# Patient Record
Sex: Male | Born: 1960 | Race: White | Hispanic: No | Marital: Married | Smoking: Former smoker
Health system: Southern US, Community
[De-identification: ages and names within clinical notes are randomized; demographics above are authoritative.]

## PROBLEM LIST (undated history)

## (undated) DIAGNOSIS — K635 Polyp of colon: Secondary | ICD-10-CM

## (undated) DIAGNOSIS — C3490 Malignant neoplasm of unspecified part of unspecified bronchus or lung: Secondary | ICD-10-CM

## (undated) DIAGNOSIS — E800 Hereditary erythropoietic porphyria: Secondary | ICD-10-CM

## (undated) HISTORY — DX: Polyp of colon: K63.5

## (undated) HISTORY — PX: TESTICLE SURGERY: SHX794

## (undated) HISTORY — DX: Hereditary erythropoietic porphyria: E80.0

## (undated) HISTORY — PX: TONSILLECTOMY AND ADENOIDECTOMY: SUR1326

## (undated) HISTORY — PX: BACK SURGERY: SHX140

---

## 2002-09-24 ENCOUNTER — Inpatient Hospital Stay (HOSPITAL_COMMUNITY): Admission: RE | Admit: 2002-09-24 | Discharge: 2002-09-25 | Payer: Self-pay | Admitting: Neurosurgery

## 2002-09-24 ENCOUNTER — Encounter: Payer: Self-pay | Admitting: Neurosurgery

## 2010-07-15 ENCOUNTER — Ambulatory Visit: Payer: Self-pay | Admitting: Gastroenterology

## 2013-08-14 ENCOUNTER — Emergency Department: Payer: Self-pay | Admitting: Internal Medicine

## 2013-08-14 LAB — COMPREHENSIVE METABOLIC PANEL
Albumin: 3.4 g/dL (ref 3.4–5.0)
Alkaline Phosphatase: 63 U/L
Anion Gap: 3 — ABNORMAL LOW (ref 7–16)
BUN: 12 mg/dL (ref 7–18)
Bilirubin,Total: 0.4 mg/dL (ref 0.2–1.0)
Calcium, Total: 8.7 mg/dL (ref 8.5–10.1)
Chloride: 105 mmol/L (ref 98–107)
Co2: 27 mmol/L (ref 21–32)
Creatinine: 0.98 mg/dL (ref 0.60–1.30)
EGFR (African American): >60
EGFR (Non-African Amer.): >60
Glucose: 110 mg/dL — ABNORMAL HIGH (ref 65–99)
Osmolality: 270 (ref 275–301)
Potassium: 4.1 mmol/L (ref 3.5–5.1)
SGOT(AST): 22 U/L (ref 15–37)
SGPT (ALT): 22 U/L (ref 12–78)
Sodium: 135 mmol/L — ABNORMAL LOW (ref 136–145)
Total Protein: 7.4 g/dL (ref 6.4–8.2)

## 2013-08-14 LAB — CBC
HCT: 38.9 % — ABNORMAL LOW (ref 40.0–52.0)
HGB: 13.3 g/dL (ref 13.0–18.0)
MCH: 29 pg (ref 26.0–34.0)
MCHC: 34.2 g/dL (ref 32.0–36.0)
MCV: 85 fL (ref 80–100)
Platelet: 229 10*3/uL (ref 150–440)
RBC: 4.59 10*6/uL (ref 4.40–5.90)
RDW: 14.7 % — ABNORMAL HIGH (ref 11.5–14.5)
WBC: 8.1 10*3/uL (ref 3.8–10.6)

## 2013-08-14 LAB — URINALYSIS, COMPLETE
Bacteria: NONE SEEN
Bilirubin,UR: NEGATIVE
Blood: NEGATIVE
Glucose,UR: NEGATIVE mg/dL (ref 0–75)
Ketone: NEGATIVE
Leukocyte Esterase: NEGATIVE
NITRITE: NEGATIVE
Ph: 6 (ref 4.5–8.0)
Protein: NEGATIVE
RBC,UR: 1 /HPF (ref 0–5)
SQUAMOUS EPITHELIAL: NONE SEEN
Specific Gravity: 1.006 (ref 1.003–1.030)
WBC UR: NONE SEEN /HPF (ref 0–5)

## 2013-08-14 LAB — LIPASE, BLOOD: Lipase: 100 U/L (ref 73–393)

## 2013-08-26 ENCOUNTER — Ambulatory Visit (INDEPENDENT_AMBULATORY_CARE_PROVIDER_SITE_OTHER): Payer: Self-pay | Admitting: Surgery

## 2015-07-12 IMAGING — CT CT ABD-PELV W/ CM
2 of 5 series · 17 of 46 positions shown, 19 images · IV contrast (isovue)
Comparison: None.

CLINICAL DATA: Abdominal pain

EXAM:
CT ABDOMEN AND PELVIS WITH CONTRAST
TECHNIQUE: Multidetector CT imaging of the abdomen and pelvis was performed
using the standard protocol following bolus administration of
intravenous contrast.
CONTRAST:  125 mL Isovue 370

[Series 2: routine abd pel with · axial · 0.73mm/px · z∈[+286,+732]mm · 14 of 99 slices shown, 16 images]
[im 5/99  soft-tissue]
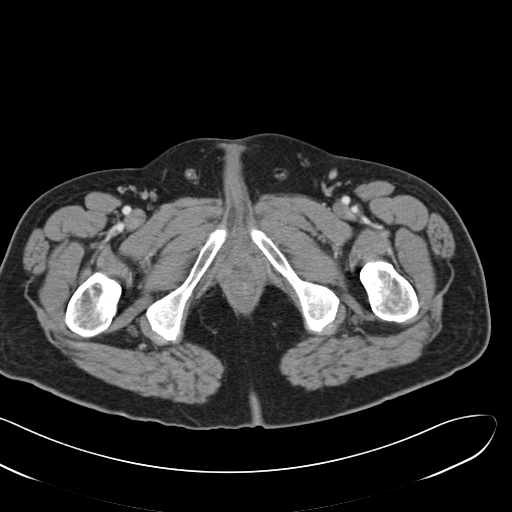
[im 5/99  bone]
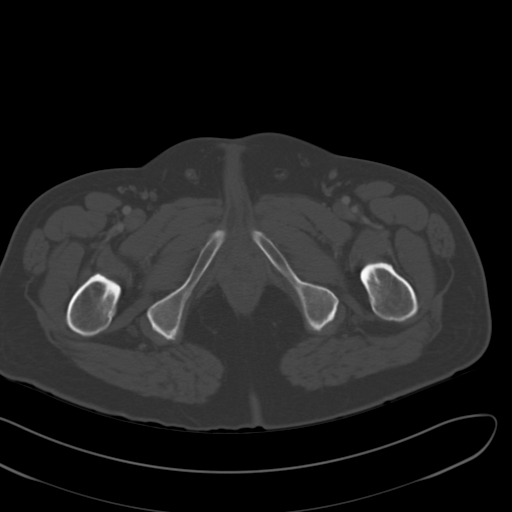
[im 15/99  soft-tissue]
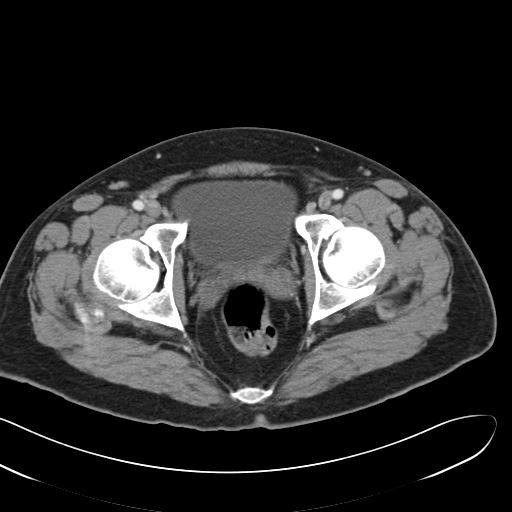
[im 20/99  soft-tissue]
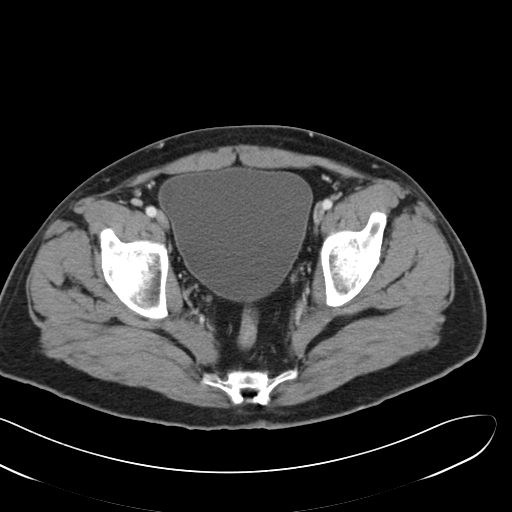
[im 25/99  soft-tissue]
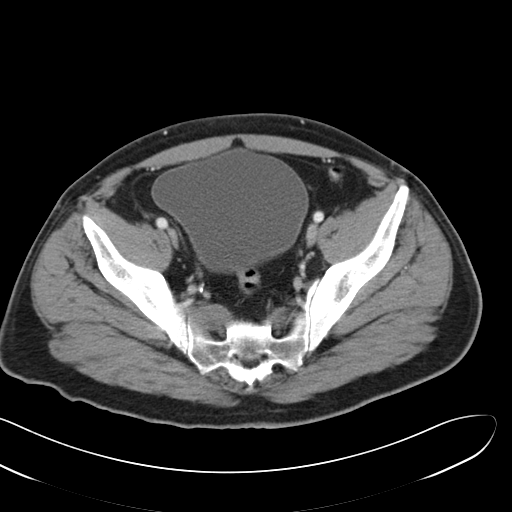
[im 35/99  soft-tissue]
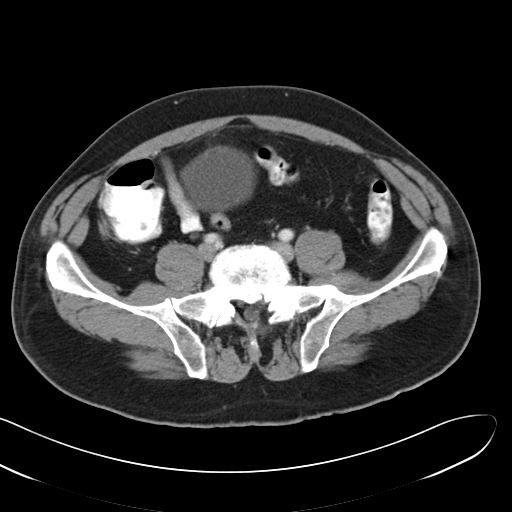
[im 40/99  soft-tissue]
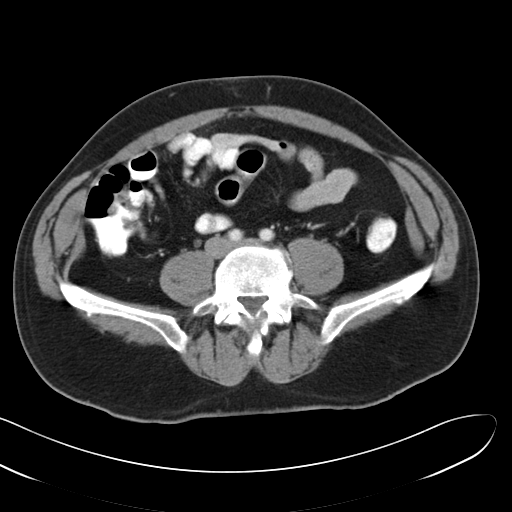
[im 45/99  soft-tissue]
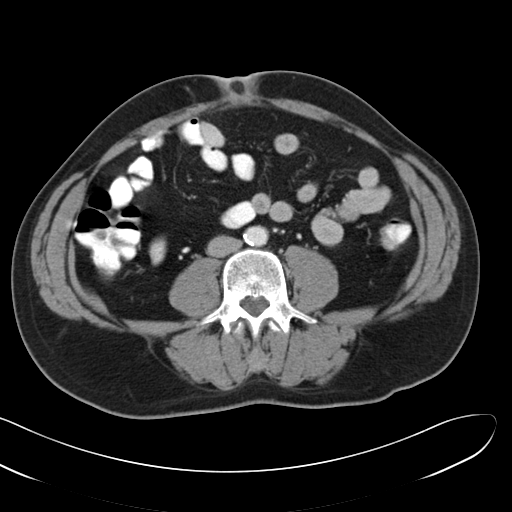
[im 54/99  soft-tissue]
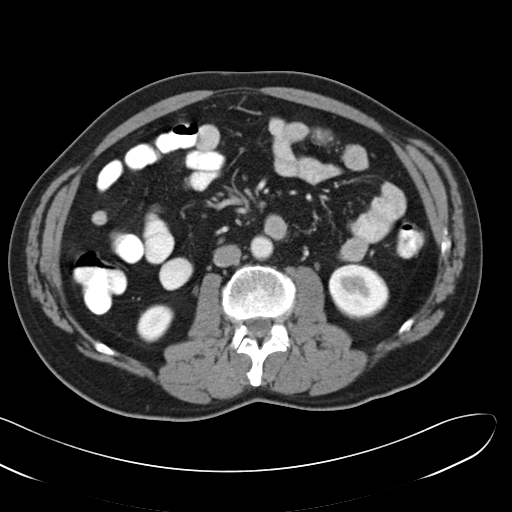
[im 59/99  soft-tissue]
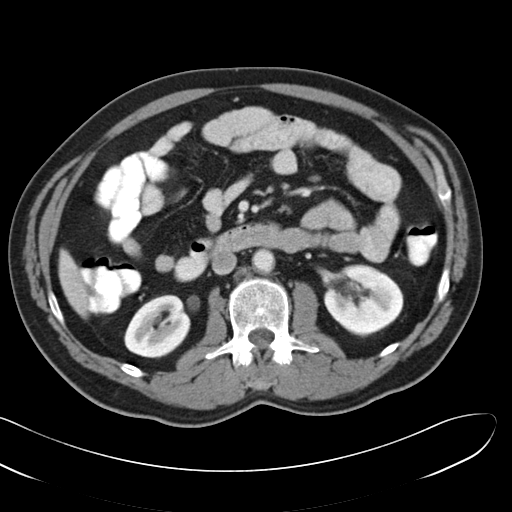
[im 59/99  bone]
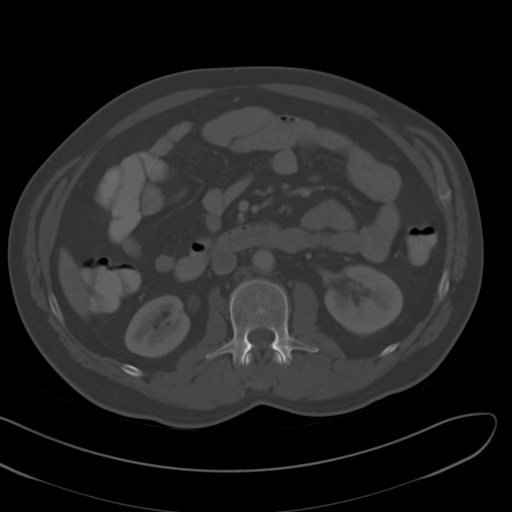
[im 64/99  soft-tissue]
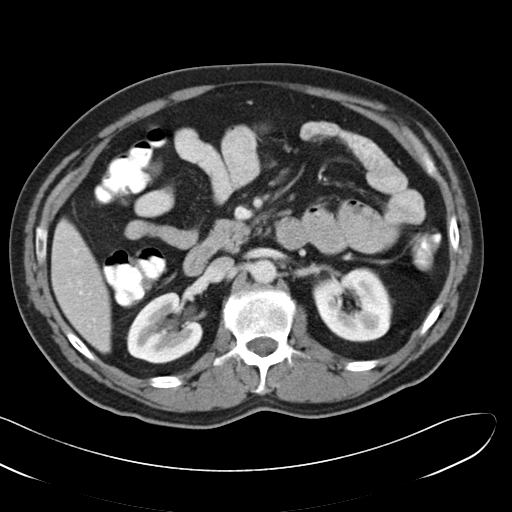
[im 74/99  soft-tissue]
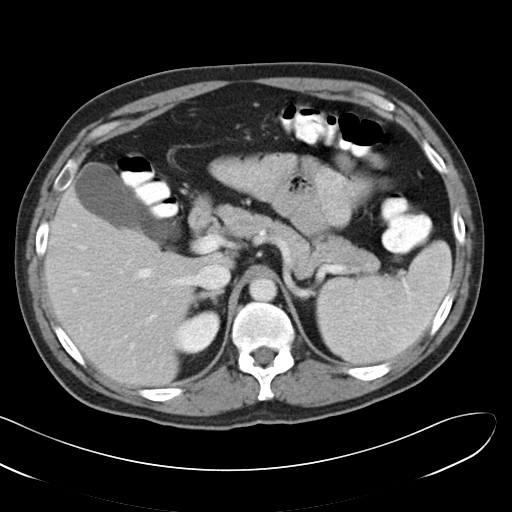
[im 79/99  soft-tissue]
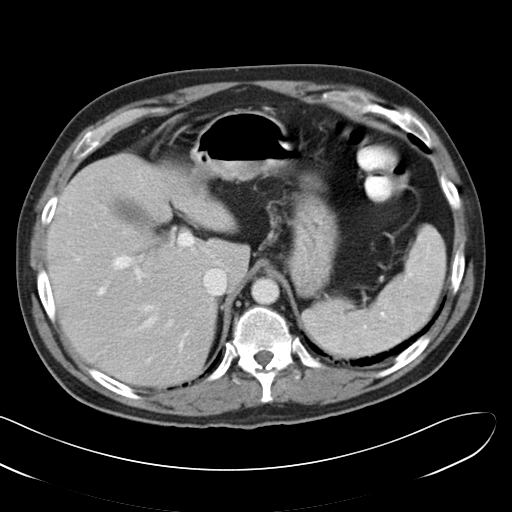
[im 84/99  soft-tissue]
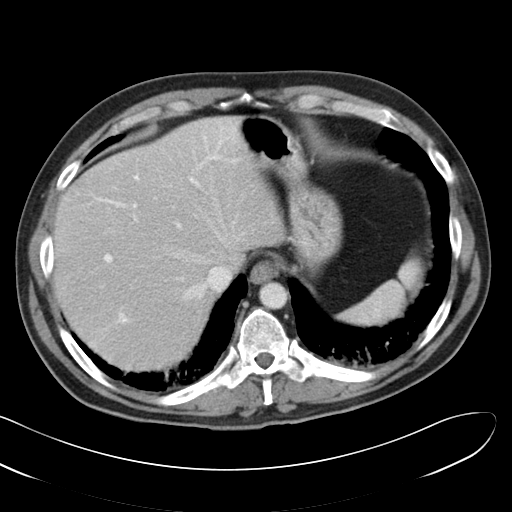
[im 94/99  soft-tissue]
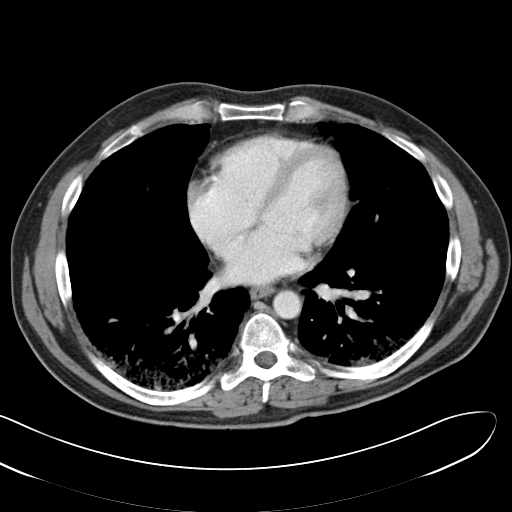

[Series 6: cor routine abd pel with · coronal · 0.96mm/px · 3 of 125 slices shown]
[im 42/125  soft-tissue]
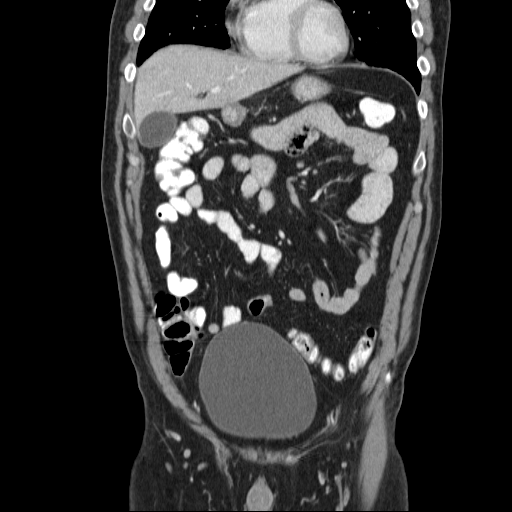
[im 56/125  soft-tissue]
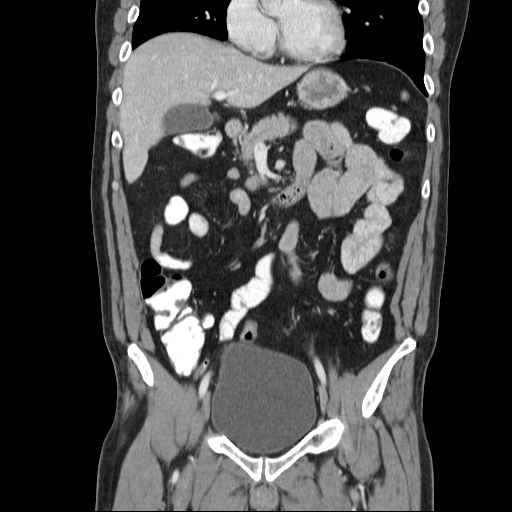
[im 69/125  soft-tissue]
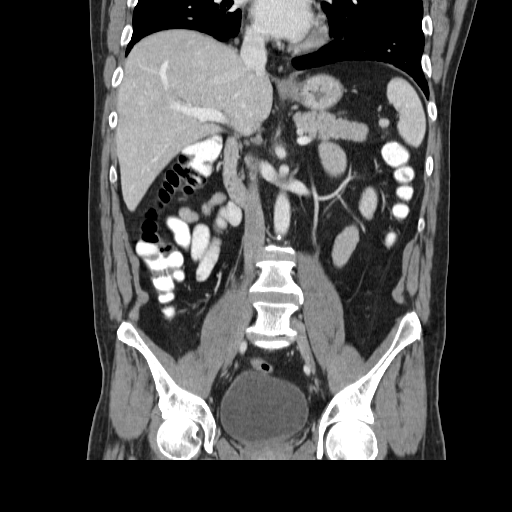

[17 of 46 positions shown; findings below may reference images not displayed]

FINDINGS: The liver, spleen, pancreas, gallbladder, adrenal glands and kidneys
are normal. There is no hydronephrosis bilaterally. There is
atherosclerosis of the abdominal aorta without aneurysmal
dilatation. There is no abdominal lymphadenopathy. There is no small
bowel obstruction or diverticulitis. The appendix is normal. There
is small umbilical herniation of mesenteric fat.

Fluid-filled bladder is normal. There is no pelvic lymphadenopathy.
There are patchy opacities of the posterior lung bases at least in
part due to atelectasis but superimposed pneumonitis particularly in
the right lung base is not excluded. Degenerative joint changes of
the spine are identified.
IMPRESSION: No acute abnormality identified in the abdomen and pelvis.

There are patchy opacities of the posterior lung bases at least in
part due to atelectasis but superimposed pneumonitis particularly in
the right lung base is not excluded.

## 2016-11-22 ENCOUNTER — Ambulatory Visit: Payer: Self-pay | Admitting: Family

## 2016-11-30 ENCOUNTER — Encounter: Payer: Self-pay | Admitting: Family

## 2016-11-30 ENCOUNTER — Ambulatory Visit (INDEPENDENT_AMBULATORY_CARE_PROVIDER_SITE_OTHER): Payer: PRIVATE HEALTH INSURANCE | Admitting: Family

## 2016-11-30 VITALS — BP 132/70 | HR 81 | Temp 98.4°F | Resp 18 | Ht 70.0 in | Wt 167.4 lb

## 2016-11-30 DIAGNOSIS — N529 Male erectile dysfunction, unspecified: Secondary | ICD-10-CM | POA: Insufficient documentation

## 2016-11-30 DIAGNOSIS — K429 Umbilical hernia without obstruction or gangrene: Secondary | ICD-10-CM | POA: Diagnosis not present

## 2016-11-30 DIAGNOSIS — N5239 Other post-surgical erectile dysfunction: Secondary | ICD-10-CM

## 2016-11-30 MED ORDER — SILDENAFIL CITRATE 100 MG PO TABS: 100.0000 mg | ORAL_TABLET | Freq: Every day | ORAL | 0 refills | Status: DC | PRN

## 2016-11-30 NOTE — Progress Notes (Signed)
Subjective:    Patient ID: Isaac Cooper, male    DOB: [birth year], 56 y.o.   MRN: [id number]  Chief Complaint  Patient presents with  . Establish Care    states he has a hernia he wanted to talk about    HPI:  Isaac Cooper is a 56 y.o. 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 and presents today for an office visit to establish care.   1.) Hernia - This is a new problem. Associated symptom of a hernia located around his umbilicus has been going on for several years. No current pain or worsening of symptoms. CT scan previously performed in 2015 which showed a small umbilical hernia of mesenteric fat. Denies any problems with coughing, sneezing or straining.   2.) Erectile dysfunction - Experiencing the associated symptom of erectile dysfunction. Able to obtain an erection, however described as weak. Modifying factors include Viagra which have helped in the past. Started following a back surgery.    No Known Allergies    No outpatient prescriptions prior to visit.   No facility-administered medications prior to visit.       Past Surgical History:  Procedure Laterality Date  . BACK SURGERY    . TESTICLE SURGERY    . TONSILLECTOMY AND ADENOIDECTOMY        Past Medical History:  Diagnosis Date  . Colon polyps   . 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  Respiratory: Negative for chest tightness and shortness of breath.   Cardiovascular: Negative for chest pain, palpitations and leg swelling.  Gastrointestinal: Negative for abdominal distention, abdominal pain, anal bleeding, blood in stool, constipation, diarrhea, nausea, rectal pain and vomiting.       Positive for hernia  Genitourinary:       Positive for erectile dysfunction.       Objective:    BP 132/70 (BP Location: Left Arm, Patient Position: Sitting, Cuff Size: Normal)    Pulse 81   Temp 98.4 F (36.9 C) (Oral)   Resp 18   Ht 5\' 10"  (1.778 m)   Wt 167 lb 6.4 oz (75.9 kg)   SpO2 93%   BMI 24.02 kg/m  Nursing note and vital signs reviewed.  Physical Exam  Constitutional: He is oriented to person, place, and time. He appears well-developed and well-nourished. No distress.  Cardiovascular: Normal rate, regular rhythm, normal heart sounds and intact distal pulses.   Pulmonary/Chest: Effort normal and breath sounds normal.  Abdominal: Normal appearance. He exhibits no mass. Bowel sounds are increased. There is no tenderness. There is no rigidity, no rebound, no guarding, no tenderness at McBurney's point and negative Murphy's sign. A hernia (Reducible umbilical hernia with no gangrene or strangulation) is present.  Neurological: He is alert and oriented to person, place, and time.  Skin: Skin is warm and dry.  Psychiatric: He has a normal mood and affect. His behavior is normal. Judgment and thought content normal.       Assessment & Plan:   Problem List Items Addressed This Visit      Genitourinary   Erectile dysfunction    New-onset erectile dysfunction likely multifactorial. Start Viagra. Administered duration and side effects discussed. Follow-up pending trial of medication.        Other   Umbilical hernia without obstruction and without gangrene - Primary    Asymptomatic umbilical hernia  that is reducible with no evidence of gangrene or obstruction. Discussed possible surgical intervention if symptoms worsen or do not improve. Continue to monitor at this time.          I am having Mr. Isaac Cooper start on sildenafil. I am also having him maintain his beta carotene.   Meds ordered this encounter  Medications  . beta carotene 25000 UNIT capsule    Sig: Take 25,000 Units by mouth daily.  . sildenafil (VIAGRA) 100 MG tablet    Sig: Take 1 tablet (100 mg total) by mouth daily as needed for erectile dysfunction.    Dispense:  10 tablet     Refill:  0    Order Specific Question:   Supervising Provider    Answer:   Pricilla Holm A [[id number]]     Follow-up: Return if symptoms worsen or fail to improve.  Mauricio Po, FNP

## 2016-11-30 NOTE — Patient Instructions (Signed)
Thank you for choosing [facility].  SUMMARY AND INSTRUCTIONS:  Continue to monitor the hernia. If worsening we will send to general surgery.  Schedule a time for your annual wellness at your convenience.   Medication:  Your prescription(s) have been submitted to your pharmacy or been printed and provided for you. Please take as directed and contact our office if you believe you are having problem(s) with the medication(s) or have any questions.  Follow up:  If your symptoms worsen or fail to improve, please contact our office for further instruction, or in case of emergency go directly to the emergency room at the closest medical facility.     Umbilical Hernia, Adult A hernia is a bulge of tissue that pushes through an opening between muscles. An umbilical hernia happens in the abdomen, near the belly button (umbilicus). The hernia may contain tissues from the small intestine, large intestine, or fatty tissue covering the intestines (omentum). Umbilical hernias in adults tend to get worse over time, and they require surgical treatment. There are several types of umbilical hernias. You may have:  A hernia located just above or below the umbilicus (indirect hernia). This is the most common type of umbilical hernia in adults.  A hernia that forms through an opening formed by the umbilicus (direct hernia).  A hernia that comes and goes (reducible hernia). A reducible hernia may be visible only when you strain, lift something heavy, or cough. This type of hernia can be pushed back into the abdomen (reduced).  A hernia that traps abdominal tissue inside the hernia (incarcerated hernia). This type of hernia cannot be reduced.  A hernia that cuts off blood flow to the tissues inside the hernia (strangulated hernia). The tissues can start to die if this happens. This type of hernia requires emergency treatment. What are the causes? An umbilical hernia happens when tissue inside the  abdomen presses on a weak area of the abdominal muscles. What increases the risk? You may have a greater risk of this condition if you:  Are obese.  Have had several pregnancies.  Have a buildup of fluid inside your abdomen (ascites).  Have had surgery that weakens the abdominal muscles. What are the signs or symptoms? The main symptom of this condition is a painless bulge at or near the belly button. A reducible hernia may be visible only when you strain, lift something heavy, or cough. Other symptoms may include:  Dull pain.  A feeling of pressure. Symptoms of a strangulated hernia may include:  Pain that gets increasingly worse.  Nausea and vomiting.  Pain when pressing on the hernia.  Skin over the hernia becoming red or purple.  Constipation.  Blood in the stool. How is this diagnosed? This condition may be diagnosed based on:  A physical exam. You may be asked to cough or strain while standing. These actions increase the pressure inside your abdomen and force the hernia through the opening in your muscles. Your health care provider may try to reduce the hernia by pressing on it.  Your symptoms and medical history. How is this treated? Surgery is the only treatment for an umbilical hernia. Surgery for a strangulated hernia is done as soon as possible. If you have a small hernia that is not incarcerated, you may need to lose weight before having surgery. Follow these instructions at home:  Lose weight, if told by your health care provider.  Do not try to push the hernia back in.  Watch your hernia  for any changes in color or size. Tell your health care provider if any changes occur.  You may need to avoid activities that increase pressure on your hernia.  Do not lift anything that is heavier than 10 lb (4.5 kg) until your health care provider says that this is safe.  Take over-the-counter and prescription medicines only as told by your health care  provider.  Keep all follow-up visits as told by your health care provider. This is important. Contact a health care provider if:  Your hernia gets larger.  Your hernia becomes painful. Get help right away if:  You develop sudden, severe pain near the area of your hernia.  You have pain as well as nausea or vomiting.  You have pain and the skin over your hernia changes color.  You develop a fever. This information is not intended to replace advice given to you by your health care provider. Make sure you discuss any questions you have with your health care provider. Document Released: 11/26/2015 Document Revised: 02/27/2016 Document Reviewed: 11/26/2015 Elsevier Interactive Patient Education  2017 [employer].

## 2016-11-30 NOTE — Assessment & Plan Note (Signed)
Asymptomatic umbilical hernia that is reducible with no evidence of gangrene or obstruction. Discussed possible surgical intervention if symptoms worsen or do not improve. Continue to monitor at this time.

## 2016-11-30 NOTE — Assessment & Plan Note (Signed)
New-onset erectile dysfunction likely multifactorial. Start Viagra. Administered duration and side effects discussed. Follow-up pending trial of medication.

## 2016-12-01 ENCOUNTER — Ambulatory Visit: Payer: Self-pay | Admitting: Family

## 2018-09-13 ENCOUNTER — Encounter: Payer: Self-pay | Admitting: Emergency Medicine

## 2018-09-13 ENCOUNTER — Inpatient Hospital Stay
Admission: EM | Admit: 2018-09-13 | Discharge: 2018-09-17 | DRG: 871 | Disposition: A | Discharge status: Home / Self Care | Payer: BLUE CROSS/BLUE SHIELD | Attending: Internal Medicine | Admitting: Internal Medicine

## 2018-09-13 ENCOUNTER — Other Ambulatory Visit: Payer: Self-pay

## 2018-09-13 ENCOUNTER — Emergency Department: Payer: BLUE CROSS/BLUE SHIELD

## 2018-09-13 DIAGNOSIS — E876 Hypokalemia: Secondary | ICD-10-CM | POA: Diagnosis present

## 2018-09-13 DIAGNOSIS — Z8261 Family history of arthritis: Secondary | ICD-10-CM | POA: Diagnosis not present

## 2018-09-13 DIAGNOSIS — Z9221 Personal history of antineoplastic chemotherapy: Secondary | ICD-10-CM

## 2018-09-13 DIAGNOSIS — J189 Pneumonia, unspecified organism: Secondary | ICD-10-CM | POA: Diagnosis present

## 2018-09-13 DIAGNOSIS — A419 Sepsis, unspecified organism: Principal | ICD-10-CM | POA: Diagnosis present

## 2018-09-13 DIAGNOSIS — D638 Anemia in other chronic diseases classified elsewhere: Secondary | ICD-10-CM | POA: Diagnosis present

## 2018-09-13 DIAGNOSIS — B379 Candidiasis, unspecified: Secondary | ICD-10-CM | POA: Diagnosis present

## 2018-09-13 DIAGNOSIS — Z8349 Family history of other endocrine, nutritional and metabolic diseases: Secondary | ICD-10-CM

## 2018-09-13 DIAGNOSIS — J440 Chronic obstructive pulmonary disease with acute lower respiratory infection: Secondary | ICD-10-CM | POA: Diagnosis present

## 2018-09-13 DIAGNOSIS — J181 Lobar pneumonia, unspecified organism: Secondary | ICD-10-CM

## 2018-09-13 DIAGNOSIS — C3490 Malignant neoplasm of unspecified part of unspecified bronchus or lung: Secondary | ICD-10-CM | POA: Diagnosis present

## 2018-09-13 DIAGNOSIS — J9601 Acute respiratory failure with hypoxia: Secondary | ICD-10-CM

## 2018-09-13 DIAGNOSIS — Z87891 Personal history of nicotine dependence: Secondary | ICD-10-CM

## 2018-09-13 DIAGNOSIS — Y95 Nosocomial condition: Secondary | ICD-10-CM | POA: Diagnosis present

## 2018-09-13 DIAGNOSIS — Z8249 Family history of ischemic heart disease and other diseases of the circulatory system: Secondary | ICD-10-CM

## 2018-09-13 DIAGNOSIS — G8929 Other chronic pain: Secondary | ICD-10-CM | POA: Diagnosis present

## 2018-09-13 DIAGNOSIS — J441 Chronic obstructive pulmonary disease with (acute) exacerbation: Secondary | ICD-10-CM | POA: Diagnosis present

## 2018-09-13 LAB — CBC WITH DIFFERENTIAL/PLATELET
Abs Immature Granulocytes: 0.15 10*3/uL — ABNORMAL HIGH (ref 0.00–0.07)
Basophils Absolute: 0.1 10*3/uL (ref 0.0–0.1)
Basophils Relative: 0 %
EOS PCT: 1 %
Eosinophils Absolute: 0.1 10*3/uL (ref 0.0–0.5)
HEMATOCRIT: 27.6 % — AB (ref 39.0–52.0)
HEMOGLOBIN: 8.3 g/dL — AB (ref 13.0–17.0)
Immature Granulocytes: 1 %
Lymphocytes Relative: 15 %
Lymphs Abs: 2 10*3/uL (ref 0.7–4.0)
MCH: 27.6 pg (ref 26.0–34.0)
MCHC: 30.1 g/dL (ref 30.0–36.0)
MCV: 91.7 fL (ref 80.0–100.0)
Monocytes Absolute: 0.7 10*3/uL (ref 0.1–1.0)
Monocytes Relative: 5 %
Neutro Abs: 10.7 10*3/uL — ABNORMAL HIGH (ref 1.7–7.7)
Neutrophils Relative %: 78 %
Platelets: 295 10*3/uL (ref 150–400)
RBC: 3.01 MIL/uL — ABNORMAL LOW (ref 4.22–5.81)
RDW: 17.5 % — ABNORMAL HIGH (ref 11.5–15.5)
Smear Review: NORMAL
WBC: 14 10*3/uL — ABNORMAL HIGH (ref 4.0–10.5)
nRBC: 0 % (ref 0.0–0.2)

## 2018-09-13 LAB — BASIC METABOLIC PANEL
Anion gap: 11 (ref 5–15)
BUN: 14 mg/dL (ref 6–20)
CHLORIDE: 96 mmol/L — AB (ref 98–111)
CO2: 26 mmol/L (ref 22–32)
Calcium: 7.9 mg/dL — ABNORMAL LOW (ref 8.9–10.3)
Creatinine, Ser: 0.47 mg/dL — ABNORMAL LOW (ref 0.61–1.24)
GFR calc Af Amer: >60 mL/min (ref >60)
GFR calc non Af Amer: >60 mL/min (ref >60)
Glucose, Bld: 149 mg/dL — ABNORMAL HIGH (ref 70–99)
Potassium: 3.3 mmol/L — ABNORMAL LOW (ref 3.5–5.1)
Sodium: 133 mmol/L — ABNORMAL LOW (ref 135–145)

## 2018-09-13 LAB — TROPONIN I: Troponin I: <0.03 ng/mL (ref <0.03)

## 2018-09-13 LAB — BLOOD GAS, VENOUS
ACID-BASE EXCESS: 6.2 mmol/L — AB (ref 0.0–2.0)
Bicarbonate: 30.6 mmol/L — ABNORMAL HIGH (ref 20.0–28.0)
O2 Saturation: 88.1 %
PCO2 VEN: 42 mmHg — AB (ref 44.0–60.0)
Patient temperature: 37
pH, Ven: 7.47 — ABNORMAL HIGH (ref 7.250–7.430)
pO2, Ven: 51 mmHg — ABNORMAL HIGH (ref 32.0–45.0)

## 2018-09-13 LAB — BRAIN NATRIURETIC PEPTIDE: B Natriuretic Peptide: 174 pg/mL — ABNORMAL HIGH (ref 0.0–100.0)

## 2018-09-13 LAB — LACTIC ACID, PLASMA: Lactic Acid, Venous: 2.2 mmol/L (ref 0.5–1.9)

## 2018-09-13 MED ORDER — ACETAMINOPHEN 650 MG RE SUPP: 650.0000 mg | Freq: Four times a day (QID) | RECTAL | Status: DC | PRN

## 2018-09-13 MED ORDER — VANCOMYCIN HCL IN DEXTROSE 1-5 GM/200ML-% IV SOLN
1000.0000 mg | INTRAVENOUS | Status: DC
  Administered 2018-09-14: 16:00:00 1000 mg via INTRAVENOUS
  Filled 2018-09-13 (×2): qty 200

## 2018-09-13 MED ORDER — GUAIFENESIN-DM 100-10 MG/5ML PO SYRP: 5.0000 mL | ORAL_SOLUTION | ORAL | Status: DC | PRN

## 2018-09-13 MED ORDER — IPRATROPIUM-ALBUTEROL 0.5-2.5 (3) MG/3ML IN SOLN
3.0000 mL | Freq: Once | RESPIRATORY_TRACT | Status: AC
  Administered 2018-09-13: 3 mL via RESPIRATORY_TRACT

## 2018-09-13 MED ORDER — PIPERACILLIN-TAZOBACTAM 3.375 G IVPB 30 MIN
3.3750 g | Freq: Once | INTRAVENOUS | Status: AC
  Administered 2018-09-13: 3.375 g via INTRAVENOUS
  Filled 2018-09-13: qty 50

## 2018-09-13 MED ORDER — SODIUM CHLORIDE 0.9 % IV SOLN
Freq: Once | INTRAVENOUS | Status: AC
  Administered 2018-09-13: 22:00:00 via INTRAVENOUS

## 2018-09-13 MED ORDER — BENZONATATE 100 MG PO CAPS
200.0000 mg | ORAL_CAPSULE | Freq: Three times a day (TID) | ORAL | Status: DC | PRN
  Administered 2018-09-14: 06:00:00 200 mg via ORAL
  Filled 2018-09-13: qty 2

## 2018-09-13 MED ORDER — IPRATROPIUM-ALBUTEROL 0.5-2.5 (3) MG/3ML IN SOLN: 3.0000 mL | RESPIRATORY_TRACT | Status: DC | PRN

## 2018-09-13 MED ORDER — METHYLPREDNISOLONE SODIUM SUCC 125 MG IJ SOLR
125.0000 mg | Freq: Once | INTRAMUSCULAR | Status: AC
  Administered 2018-09-13: 125 mg via INTRAVENOUS
  Filled 2018-09-13: qty 2

## 2018-09-13 MED ORDER — ACETAMINOPHEN 325 MG PO TABS
650.0000 mg | ORAL_TABLET | Freq: Four times a day (QID) | ORAL | Status: DC | PRN
  Administered 2018-09-14: 650 mg via ORAL
  Filled 2018-09-13: qty 2

## 2018-09-13 MED ORDER — VANCOMYCIN HCL IN DEXTROSE 1-5 GM/200ML-% IV SOLN
1000.0000 mg | Freq: Once | INTRAVENOUS | Status: AC
  Administered 2018-09-13: 1000 mg via INTRAVENOUS
  Filled 2018-09-13: qty 200

## 2018-09-13 MED ORDER — SODIUM CHLORIDE 0.9 % IV SOLN
INTRAVENOUS | Status: DC
  Administered 2018-09-13: via INTRAVENOUS

## 2018-09-13 MED ORDER — SODIUM CHLORIDE 0.9 % IV SOLN
2.0000 g | Freq: Three times a day (TID) | INTRAVENOUS | Status: DC
  Administered 2018-09-14 – 2018-09-17 (×10): 2 g via INTRAVENOUS
  Filled 2018-09-13 (×12): qty 2

## 2018-09-13 MED ORDER — METHYLPREDNISOLONE SODIUM SUCC 125 MG IJ SOLR
60.0000 mg | Freq: Four times a day (QID) | INTRAMUSCULAR | Status: DC
  Administered 2018-09-14 (×2): 60 mg via INTRAVENOUS
  Filled 2018-09-13 (×3): qty 2

## 2018-09-13 MED ORDER — LEVOFLOXACIN IN D5W 750 MG/150ML IV SOLN: 750.0000 mg | Freq: Once | INTRAVENOUS | Status: DC

## 2018-09-13 MED ORDER — ONDANSETRON HCL 4 MG/2ML IJ SOLN: 4.0000 mg | Freq: Four times a day (QID) | INTRAMUSCULAR | Status: DC | PRN

## 2018-09-13 MED ORDER — ONDANSETRON HCL 4 MG PO TABS: 4.0000 mg | ORAL_TABLET | Freq: Four times a day (QID) | ORAL | Status: DC | PRN

## 2018-09-13 MED ORDER — ENOXAPARIN SODIUM 40 MG/0.4ML ~~LOC~~ SOLN
40.0000 mg | SUBCUTANEOUS | Status: DC
  Administered 2018-09-14 – 2018-09-16 (×3): 40 mg via SUBCUTANEOUS
  Filled 2018-09-13 (×3): qty 0.4

## 2018-09-13 NOTE — ED Notes (Signed)
ED TO INPATIENT HANDOFF REPORT  ED Nurse Name and Phone #: Ena Dawley [phone]  S Name/Age/Gender Isaac Cooper 58 y.o. male Room/Bed: ED06A/ED06A  Code Status   Code Status: Not on file  Home/SNF/Other Home Patient oriented to: self, place, time and situation Is this baseline? Yes   Triage Complete: Triage complete  Chief Complaint SOB  Triage Note Patient presents to Emergency Department via [facility] from HOME with complaints of difficulty breathing today.  PT arrived on BIPAP  Hx of Stage 3 lung cancer and per EMS coughing up thick sputum       Allergies No Known Allergies  Level of Care/Admitting Diagnosis ED Disposition    ED Disposition Condition [place]: [place] [2018/12/15]  Level of Care: Med-Surg [16]  Diagnosis: Sepsis [facility]) [[id number]]  Admitting Physician: Lance Coon [[id number]]  Attending Physician: Lance Coon [phone]  Estimated length of stay: past midnight tomorrow  Certification:: I certify this patient will need inpatient services for at least 2 midnights  PT Class (Do Not Modify): Inpatient [101]  PT Acc Code (Do Not Modify): Private [1]       B Medical/Surgery History Past Medical History:  Diagnosis Date  . Colon polyps   . EPP (erythropoietic protoporphyria) [facility])    Past Surgical History:  Procedure Laterality Date  . BACK SURGERY    . TESTICLE SURGERY    . TONSILLECTOMY AND ADENOIDECTOMY       A IV Location/Drains/Wounds Patient Lines/Drains/Airways Status   Active Line/Drains/Airways    Name:   Placement date:   Placement time:   Site:   Days:   Peripheral IV 09/13/18 Right Antecubital   09/13/18    2010    Antecubital   less than 1   Peripheral IV 09/13/18 Left Forearm   09/13/18    2029    Forearm   less than 1          Intake/Output Last 24 hours  Intake/Output Summary (Last 24 hours) at 09/13/2018 2224 Last data filed at 09/13/2018 2204 Gross per 24 hour  Intake 50 ml   Output -  Net 50 ml    Labs/Imaging Results for orders placed or performed during the hospital encounter of 09/13/18 (from the past 48 hour(s))  CBC with Differential     Status: Abnormal   Collection Time: 09/13/18  8:17 PM  Result Value Ref Range   WBC 14.0 (H) 4.0 - 10.5 K/uL   RBC 3.01 (L) 4.22 - 5.81 MIL/uL   Hemoglobin 8.3 (L) 13.0 - 17.0 g/dL   HCT 27.6 (L) 39.0 - 52.0 %   MCV 91.7 80.0 - 100.0 fL   MCH 27.6 26.0 - 34.0 pg   MCHC 30.1 30.0 - 36.0 g/dL   RDW 17.5 (H) 11.5 - 15.5 %   Platelets 295 150 - 400 K/uL   nRBC 0.0 0.0 - 0.2 %   Neutrophils Relative % 78 %   Neutro Abs 10.7 (H) 1.7 - 7.7 K/uL   Lymphocytes Relative 15 %   Lymphs Abs 2.0 0.7 - 4.0 K/uL   Monocytes Relative 5 %   Monocytes Absolute 0.7 0.1 - 1.0 K/uL   Eosinophils Relative 1 %   Eosinophils Absolute 0.1 0.0 - 0.5 K/uL   Basophils Relative 0 %   Basophils Absolute 0.1 0.0 - 0.1 K/uL   WBC Morphology TOXIC GRANULATION    Smear Review Normal platelet morphology    Immature Granulocytes 1 %  Abs Immature Granulocytes 0.15 (H) 0.00 - 0.07 K/uL   Polychromasia PRESENT     Comment: Performed at [facility], [address] [place], [place] [address]  Basic metabolic panel     Status: Abnormal   Collection Time: 09/13/18  8:17 PM  Result Value Ref Range   Sodium 133 (L) 135 - 145 mmol/L   Potassium 3.3 (L) 3.5 - 5.1 mmol/L   Chloride 96 (L) 98 - 111 mmol/L   CO2 26 22 - 32 mmol/L   Glucose, Bld 149 (H) 70 - 99 mg/dL   BUN 14 6 - 20 mg/dL   Creatinine, Ser 0.47 (L) 0.61 - 1.24 mg/dL   Calcium 7.9 (L) 8.9 - 10.3 mg/dL   GFR calc non Af Amer >60 >60 mL/min   GFR calc Af Amer >60 >60 mL/min   Anion gap 11 5 - 15    Comment: Performed at [facility], [address], [place], [place] [address]  Brain natriuretic peptide     Status: Abnormal   Collection Time: 09/13/18  8:17 PM  Result Value Ref Range   B Natriuretic Peptide 174.0 (H) 0.0 - 100.0 pg/mL    Comment: Performed  at [facility], [address] [place], [place] [address]  Blood gas, venous     Status: Abnormal   Collection Time: 09/13/18  8:17 PM  Result Value Ref Range   pH, Ven 7.47 (H) 7.250 - 7.430   pCO2, Ven 42 (L) 44.0 - 60.0 mmHg   pO2, Ven 51.0 (H) 32.0 - 45.0 mmHg   Bicarbonate 30.6 (H) 20.0 - 28.0 mmol/L   Acid-Base Excess 6.2 (H) 0.0 - 2.0 mmol/L   O2 Saturation 88.1 %   Patient temperature 37.0    Sample type VENOUS     Comment: Performed at [facility], [address] [place], [place] [address]  Troponin I - ONCE - STAT     Status: None   Collection Time: 09/13/18  8:17 PM  Result Value Ref Range   Troponin I <0.03 <0.03 ng/mL    Comment: Performed at [facility], [address] [place], [place] [address]  Lactic acid, plasma     Status: Abnormal   Collection Time: 09/13/18  8:17 PM  Result Value Ref Range   Lactic Acid, Venous 2.2 (HH) 0.5 - 1.9 mmol/L    Comment: CRITICAL RESULT CALLED TO, READ BACK BY AND VERIFIED WITH Gowri Suchan WEBSTER 09/13/18 @ 2140  [facility] Performed at [facility], [address], [place], [place] [address]    Dg Chest Port 1 View  Result Date: 09/13/2018 CLINICAL DATA:  Difficulty breathing today. Stage III lung cancer. Productive cough. EXAM: PORTABLE CHEST 1 VIEW COMPARISON:  None. FINDINGS: The heart size is normal. Right pleural effusion is present. Interstitial and airspace disease superimposed on chronic lung disease. Emphysematous changes are noted. No discrete mass lesion is present. IMPRESSION: 1. Asymmetric right lower lobe airspace disease. This is concerning for acute edema or infection superimposed on chronic interstitial lung disease. 2. Asymmetric right pleural effusion. Electronically Signed   By: San Morelle M.D.   On: 09/13/2018 20:36    Pending Labs Unresulted Labs (From admission, onward)    Start     Ordered   09/13/18 2100  Blood culture (routine x 2)  BLOOD CULTURE X 2,   STAT      09/13/18 2059   Signed and Held  HIV antibody (Routine Testing)  Once,  R     Signed and Held   Signed and Held  CBC  (enoxaparin (LOVENOX)    CrCl >/= 30 ml/min)  Once,   R    Comments:  Baseline for enoxaparin therapy IF NOT ALREADY DRAWN.  Notify MD if PLT < 100 K.    Signed and Held   Signed and Held  Creatinine, serum  (enoxaparin (LOVENOX)    CrCl >/= 30 ml/min)  Once,   R    Comments:  Baseline for enoxaparin therapy IF NOT ALREADY DRAWN.    Signed and Held   Signed and Held  Creatinine, serum  (enoxaparin (LOVENOX)    CrCl >/= 30 ml/min)  Weekly,   R    Comments:  while on enoxaparin therapy    Signed and Held   Signed and Held  Basic metabolic panel  Tomorrow morning,   R     Signed and Held   Signed and Held  CBC  Tomorrow morning,   R     Signed and Held          Vitals/Pain Today's Vitals   09/13/18 2123 09/13/18 2125 09/13/18 2142 09/13/18 2200  BP: (!) 96/59   118/66  Pulse: (!) 126   (!) 118  Resp: (!) 27   (!) 22  Temp: 98 F (36.7 C)     TempSrc: Oral     SpO2: 90%   97%  Weight:      Height:      PainSc: 0-No pain 0-No pain 0-No pain     Isolation Precautions No active isolations  Medications Medications  vancomycin (VANCOCIN) IVPB 1000 mg/200 mL premix (1,000 mg Intravenous New Bag/Given 09/13/18 2139)  ipratropium-albuterol (DUONEB) 0.5-2.5 (3) MG/3ML nebulizer solution 3 mL (3 mLs Nebulization Given by Other 09/13/18 2022)  methylPREDNISolone sodium succinate (SOLU-MEDROL) 125 mg/2 mL injection 125 mg (125 mg Intravenous Given 09/13/18 2029)  piperacillin-tazobactam (ZOSYN) IVPB 3.375 g (0 g Intravenous Stopped 09/13/18 2204)  0.9 %  sodium chloride infusion ( Intravenous New Bag/Given 09/13/18 2145)    Mobility walks Low fall risk   Focused Assessments Pulmonary Assessment Handoff:  Lung sounds: L Breath Sounds: Expiratory wheezes, Diminished R Breath Sounds: Diminished, Expiratory wheezes O2 Device: Nasal Cannula O2 Flow Rate (L/min): 6  L/min      R Recommendations: See Admitting Provider Note  Report given to:   Additional Notes: family at bedside, wife may stay the night, most care done at [facility]

## 2018-09-13 NOTE — ED Provider Notes (Signed)
[facility] Emergency Department Provider Note       Time seen: ----------------------------------------- 8:15 PM on 09/13/2018 ----------------------------------------- Level V caveat: History/ROS limited by respiratory distress  I have reviewed the triage vital signs and the nursing notes.  HISTORY   Chief Complaint Respiratory Distress   HPI Isaac Cooper is a 58 y.o. male with a history of squamous cell carcinoma of the lung who presents to the ED for respiratory distress.  Patient reports through CPAP that he has had worsening shortness of breath today.  He states he has new peripheral edema.  No further information is available.  Past Medical History:  Diagnosis Date  . Colon polyps   . EPP (erythropoietic protoporphyria) [facility])     Patient Active Problem List   Diagnosis Date Noted  . Umbilical hernia without obstruction and without gangrene 11/30/2016  . Erectile dysfunction 11/30/2016    Past Surgical History:  Procedure Laterality Date  . BACK SURGERY    . TESTICLE SURGERY    . TONSILLECTOMY AND ADENOIDECTOMY      Allergies Patient has no known allergies.  Social History Social History   Tobacco Use  . Smoking status: Current Every Day Smoker    Packs/day: 1.25    Years: 45.00    Pack years: 56.25    Types: Cigarettes  . Smokeless tobacco: Never Used  Substance Use Topics  . Alcohol use: Yes    Comment: About 2 per week   . Drug use: No   Review of Systems Positive for shortness of breath and peripheral edema, otherwise unknown at this time  All systems negative/normal/unremarkable except as stated in the HPI  ____________________________________________   PHYSICAL EXAM:  VITAL SIGNS: ED Triage Vitals  Enc Vitals Group     BP      Pulse      Resp      Temp      Temp src      SpO2      Weight      Height      Head Circumference      Peak Flow      Pain Score      Pain Loc      Pain Edu?      Excl. in  [place]?    Constitutional: Alert and oriented.  Mild to moderate distress Eyes: Conjunctivae are normal. Normal extraocular movements. ENT      Head: Normocephalic and atraumatic.      Nose: No congestion/rhinnorhea.      Mouth/Throat: Mucous membranes are moist.      Neck: No stridor. Cardiovascular: Rapid rate, regular rhythm. No murmurs, rubs, or gallops. Respiratory: Tachypnea with wheezing and rhonchi bilaterally Gastrointestinal: Soft and nontender. Normal bowel sounds Musculoskeletal: Nontender with normal range of motion in extremities. No lower extremity tenderness nor edema. Neurologic:  Normal speech and language. No gross focal neurologic deficits are appreciated.  Skin:  Skin is warm, diaphoresis is noted Psychiatric: Somewhat anxious ____________________________________________  EKG: Interpreted by me.  Sinus tachycardia with a rate of 136 bpm, nonspecific ST segment changes, normal axis, long QT  ____________________________________________  ED COURSE:  As part of my medical decision making, I reviewed the following data within the [place] History obtained from family if available, nursing notes, old chart and ekg, as well as notes from prior ED visits. Patient presented for respiratory distress, we will assess with labs and imaging as indicated at this time.   Procedures  ____________________________________________   LABS (pertinent positives/negatives)  Labs Reviewed  CBC WITH DIFFERENTIAL/PLATELET - Abnormal; Notable for the following components:      Result Value   WBC 14.0 (*)    RBC 3.01 (*)    Hemoglobin 8.3 (*)    HCT 27.6 (*)    RDW 17.5 (*)    All other components within normal limits  BLOOD GAS, VENOUS - Abnormal; Notable for the following components:   pH, Ven 7.47 (*)    pCO2, Ven 42 (*)    pO2, Ven 51.0 (*)    Bicarbonate 30.6 (*)    Acid-Base Excess 6.2 (*)    All other components within normal limits  CULTURE, BLOOD  (ROUTINE X 2)  CULTURE, BLOOD (ROUTINE X 2)  BASIC METABOLIC PANEL  BRAIN NATRIURETIC PEPTIDE  TROPONIN I  LACTIC ACID, PLASMA   CRITICAL CARE Performed by: Laurence Aly   Total critical care time: 30 minutes  Critical care time was exclusive of separately billable procedures and treating other patients.  Critical care was necessary to treat or prevent imminent or life-threatening deterioration.  Critical care was time spent personally by me on the following activities: development of treatment plan with patient and/or surrogate as well as nursing, discussions with consultants, evaluation of patient's response to treatment, examination of patient, obtaining history from patient or surrogate, ordering and performing treatments and interventions, ordering and review of laboratory studies, ordering and review of radiographic studies, pulse oximetry and re-evaluation of patient's condition.  RADIOLOGY Images were viewed by me  Chest x-ray  IMPRESSION: 1. Asymmetric right lower lobe airspace disease. This is concerning for acute edema or infection superimposed on chronic interstitial lung disease. 2. Asymmetric right pleural effusion. ____________________________________________   DIFFERENTIAL DIAGNOSIS   CHF, COPD, pneumonia, influenza, pneumothorax, PE  FINAL ASSESSMENT AND PLAN  Acute respiratory distress, right lower lobe airspace disease   Plan: The patient had presented for respiratory distress requiring BiPAP. Patient's labs did reveal significant leukocytosis.  He does have some anemia which appears to be acute on chronic.  Patient's imaging was concerning for right lower lobe airspace disease.  We have ordered IV Levaquin to treat for community-acquired pneumonia.  Patient is currently taking immunotherapy for lung cancer.  He remains on BiPAP at this time.  I will discuss with the hospitalist for admission.   Laurence Aly, MD    Note: This note was  generated in part or whole with voice recognition software. Voice recognition is usually quite accurate but there are transcription errors that can and very often do occur. I apologize for any typographical errors that were not detected and corrected.     Earleen Newport, MD 09/13/18 2101

## 2018-09-13 NOTE — ED Notes (Signed)
Family at bedside. 

## 2018-09-13 NOTE — ED Notes (Signed)
O2 increased to 6lpm d/t pt sats at 89%

## 2018-09-13 NOTE — ED Notes (Addendum)
Call to floor att  Contact info left  Alissa called for transport

## 2018-09-13 NOTE — Progress Notes (Signed)
Pharmacy Antibiotic Note  Isaac Cooper is a 58 y.o. male admitted on 09/13/2018 with sepsis.  Pharmacy has been consulted for Vancomycin , Cefepime  dosing.  Plan: Zosyn 3.375 gm IV X 1 given on 3/6 @ 2134. Cefepime 2 gm IV Q8H ordered to start on 3/7 @ 0500.   Vancomycin 1 gm IV X 1 given in ED on 3/6 @ 2139. Vancomycin 1 gm IV Q18H ordered to start on 3/7 @ 1600.   No peak and trough currently ordered.   AUC = 464.7 Vd = 39.2 L  CrCl = 77 ml/min Ke = 0.069 hr-1 T1/2 = 10.1 hrs Cmin = 11.2 mcg/mL   Height: 5\' 10"  (177.8 cm) Weight: 124 lb 6.4 oz (56.4 kg) IBW/kg (Calculated) : 73  Temp (24hrs), Avg:98.1 F (36.7 C), Min:97.9 F (36.6 C), Max:98.5 F (36.9 C)  Recent Labs  Lab 09/13/18 2017  WBC 14.0*  CREATININE 0.47*  LATICACIDVEN 2.2*    Estimated Creatinine Clearance: 80.3 mL/min (A) (by C-G formula based on SCr of 0.47 mg/dL (L)).    No Known Allergies  Antimicrobials this admission:   >>    >>   Dose adjustments this admission:   Microbiology results:  BCx:   UCx:    Sputum:    MRSA PCR:   Thank you for allowing pharmacy to be a part of this patient's care.  Deyonte Cadden D 09/13/2018 11:38 PM

## 2018-09-13 NOTE — ED Triage Notes (Signed)
Patient presents to Emergency Department via [facility] from HOME with complaints of difficulty breathing today.  PT arrived on BIPAP  Hx of Stage 3 lung cancer and per EMS coughing up thick sputum

## 2018-09-13 NOTE — ED Notes (Signed)
Multiple techs called none available for transport,, this RN will transport

## 2018-09-13 NOTE — H&P (Addendum)
[place] [facility] [place] NAME: Isaac Cooper    MR#:  [id number]  DATE OF BIRTH:  1961/06/09  DATE OF ADMISSION:  09/13/2018  PRIMARY CARE PHYSICIAN: Cletis Athens, MD   REQUESTING/REFERRING PHYSICIAN: Jimmye Norman, MD  CHIEF COMPLAINT:   Chief Complaint  Patient presents with  . Respiratory Distress    HISTORY OF PRESENT ILLNESS:  Isaac Cooper  is a 58 y.o. male who presents with chief complaint as above.  Patient presents the ED with a complaint of worsening shortness of breath and purulent sputum.  Patient states he began feeling bad about 2 weeks ago, but that is gotten much worse over the last 3 days.  On evaluation in the ED is found to meet sepsis criteria with pneumonia seen on chest x-ray.  Hospitalist were called for admission  PAST MEDICAL HISTORY:   Past Medical History:  Diagnosis Date  . Colon polyps   . EPP (erythropoietic protoporphyria) ([place])      PAST SURGICAL HISTORY:   Past Surgical History:  Procedure Laterality Date  . BACK SURGERY    . TESTICLE SURGERY    . TONSILLECTOMY AND ADENOIDECTOMY       SOCIAL HISTORY:   Social History   Tobacco Use  . Smoking status: Former Smoker    Packs/day: 1.25    Years: 45.00    Pack years: 56.25    Types: Cigarettes    Last attempt to quit: 05/16/2018    Years since quitting: 0.3  . Smokeless tobacco: Never Used  Substance Use Topics  . Alcohol use: Yes    Comment: About 2 per week      FAMILY HISTORY:   Family History  Problem Relation Age of Onset  . Arthritis Mother   . Hypertension Mother   . Hyperlipidemia Father      DRUG ALLERGIES:  No Known Allergies  MEDICATIONS AT HOME:   Prior to Admission medications   Medication Sig Start Date End Date Taking? Authorizing Provider  beta carotene 25000 UNIT capsule Take 25,000 Units by mouth daily.    [provider]  sildenafil (VIAGRA) 100 MG tablet Take 1 tablet (100 mg total) by mouth  daily as needed for erectile dysfunction. 11/30/16   [place] Circle, FNP    REVIEW OF SYSTEMS:  Review of Systems  Constitutional: Positive for malaise/fatigue. Negative for chills, fever and weight loss.  HENT: Negative for ear pain, hearing loss and tinnitus.   Eyes: Negative for blurred vision, double vision, pain and redness.  Respiratory: Positive for cough, sputum production and shortness of breath. Negative for hemoptysis.   Cardiovascular: Negative for chest pain, palpitations, orthopnea and leg swelling.  Gastrointestinal: Negative for abdominal pain, constipation, diarrhea, nausea and vomiting.  Genitourinary: Negative for dysuria, frequency and hematuria.  Musculoskeletal: Negative for back pain, joint pain and neck pain.  Skin:       No acne, rash, or lesions  Neurological: Negative for dizziness, tremors, focal weakness and weakness.  Endo/Heme/Allergies: Negative for polydipsia. Does not bruise/bleed easily.  Psychiatric/Behavioral: Negative for depression. The patient is not nervous/anxious and does not have insomnia.      VITAL SIGNS:   Vitals:   09/13/18 2018 09/13/18 2020 09/13/18 2040 09/13/18 2123  BP:   95/60 (!) 96/59  Pulse:   (!) 130 (!) 126  Resp:   (!) 24 (!) 27  Temp:    98 F (36.7 C)  TempSrc:    Oral  SpO2:  96%  96% 90%  Weight:  54.4 kg    Height:  5\' 10"  (1.778 m)     Wt Readings from Last 3 Encounters:  09/13/18 54.4 kg  11/30/16 75.9 kg    PHYSICAL EXAMINATION:  Physical Exam  Vitals reviewed. Constitutional: He is oriented to person, place, and time. He appears well-developed and well-nourished. No distress.  HENT:  Head: Normocephalic and atraumatic.  Mouth/Throat: Oropharynx is clear and moist.  Eyes: Pupils are equal, round, and reactive to light. Conjunctivae and EOM are normal. No scleral icterus.  Neck: Normal range of motion. Neck supple. No JVD present. No thyromegaly present.  Cardiovascular: Regular rhythm and intact  distal pulses. Exam reveals no gallop and no friction rub.  No murmur heard. Tachycardic  Respiratory: Effort normal. No respiratory distress. He has no wheezes. He has no rales.  Right-sided rhonchi  GI: Soft. Bowel sounds are normal. He exhibits no distension. There is no abdominal tenderness.  Musculoskeletal: Normal range of motion.        General: No edema.     Comments: No arthritis, no gout  Lymphadenopathy:    He has no cervical adenopathy.  Neurological: He is alert and oriented to person, place, and time. No cranial nerve deficit.  No dysarthria, no aphasia  Skin: Skin is warm and dry. No rash noted. No erythema.  Psychiatric: He has a normal mood and affect. His behavior is normal. Judgment and thought content normal.    LABORATORY PANEL:   CBC Recent Labs  Lab 09/13/18 2017  WBC 14.0*  HGB 8.3*  HCT 27.6*  PLT 295   ------------------------------------------------------------------------------------------------------------------  Chemistries  Recent Labs  Lab 09/13/18 2017  NA 133*  K 3.3*  CL 96*  CO2 26  GLUCOSE 149*  BUN 14  CREATININE 0.47*  CALCIUM 7.9*   ------------------------------------------------------------------------------------------------------------------  Cardiac Enzymes Recent Labs  Lab 09/13/18 2017  TROPONINI <0.03   ------------------------------------------------------------------------------------------------------------------  RADIOLOGY:  Dg Chest Port 1 View  Result Date: 09/13/2018 CLINICAL DATA:  Difficulty breathing today. Stage III lung cancer. Productive cough. EXAM: PORTABLE CHEST 1 VIEW COMPARISON:  None. FINDINGS: The heart size is normal. Right pleural effusion is present. Interstitial and airspace disease superimposed on chronic lung disease. Emphysematous changes are noted. No discrete mass lesion is present. IMPRESSION: 1. Asymmetric right lower lobe airspace disease. This is concerning for acute edema or  infection superimposed on chronic interstitial lung disease. 2. Asymmetric right pleural effusion. Electronically Signed   By: San Morelle M.D.   On: 09/13/2018 20:36    EKG:   Orders placed or performed during the hospital encounter of 09/13/18  . ED EKG  . ED EKG    IMPRESSION AND PLAN:  Principal Problem:   Sepsis ([place]) -due to pneumonia.  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  IV antibiotics started.  Blood pressure was low initially but responded well to IV fluids.  Lactic acid was initially mildly elevated 2.2, but after fluids has corrected to within normal limits.  Cultures sent from the ED Active Problems:   HCAP (healthcare-associated pneumonia) -IV antibiotics, other supportive treatment including PRN duo nebs and antitussive   Lung cancer ([place]) -patient is receiving IV infusions as above  Chart review performed and case discussed with ED provider. Labs, imaging and/or ECG reviewed by provider and discussed with patient/family. Management plans discussed with the patient and/or family.  DVT PROPHYLAXIS: SubQ lovenox   GI PROPHYLAXIS:  None  ADMISSION STATUS: Inpatient     CODE STATUS: Full Advance Directive Documentation     Most Recent Value  Type of Advance Directive  Healthcare Power of Attorney, Living will  Pre-existing out of facility DNR order (yellow form or pink MOST form)  -  "MOST" Form in Place?  -      TOTAL TIME TAKING CARE OF THIS PATIENT: 45 minutes.   Ethlyn Daniels 09/13/2018, 9:40 PM  [facility]  Office  [phone]  CC: Primary care physician; Cletis Athens, MD  Note:  This document was prepared using Dragon voice recognition software and may include unintentional dictation errors.

## 2018-09-13 NOTE — ED Notes (Signed)
CRITICAL LAB: LACTIC is 2.2, [employer], Dr. Jimmye Norman notified, orders received

## 2018-09-14 LAB — CBC
HCT: 24.2 % — ABNORMAL LOW (ref 39.0–52.0)
HEMOGLOBIN: 7.3 g/dL — AB (ref 13.0–17.0)
MCH: 27.7 pg (ref 26.0–34.0)
MCHC: 30.2 g/dL (ref 30.0–36.0)
MCV: 91.7 fL (ref 80.0–100.0)
Platelets: 184 10*3/uL (ref 150–400)
RBC: 2.64 MIL/uL — ABNORMAL LOW (ref 4.22–5.81)
RDW: 17.4 % — ABNORMAL HIGH (ref 11.5–15.5)
WBC: 5 10*3/uL (ref 4.0–10.5)
nRBC: 0 % (ref 0.0–0.2)

## 2018-09-14 LAB — INFLUENZA PANEL BY PCR (TYPE A & B)
Influenza A By PCR: NEGATIVE
Influenza B By PCR: NEGATIVE

## 2018-09-14 LAB — BASIC METABOLIC PANEL
Anion gap: 9 (ref 5–15)
BUN: 12 mg/dL (ref 6–20)
CO2: 25 mmol/L (ref 22–32)
Calcium: 7.6 mg/dL — ABNORMAL LOW (ref 8.9–10.3)
Chloride: 101 mmol/L (ref 98–111)
Creatinine, Ser: 0.59 mg/dL — ABNORMAL LOW (ref 0.61–1.24)
GFR calc Af Amer: >60 mL/min (ref >60)
GFR calc non Af Amer: >60 mL/min (ref >60)
Glucose, Bld: 177 mg/dL — ABNORMAL HIGH (ref 70–99)
Potassium: 3 mmol/L — ABNORMAL LOW (ref 3.5–5.1)
SODIUM: 135 mmol/L (ref 135–145)

## 2018-09-14 LAB — EXPECTORATED SPUTUM ASSESSMENT W GRAM STAIN, RFLX TO RESP C: Special Requests: NORMAL

## 2018-09-14 LAB — LACTIC ACID, PLASMA: Lactic Acid, Venous: 1.7 mmol/L (ref 0.5–1.9)

## 2018-09-14 LAB — MRSA PCR SCREENING: MRSA by PCR: NEGATIVE

## 2018-09-14 MED ORDER — POTASSIUM CHLORIDE CRYS ER 20 MEQ PO TBCR
40.0000 meq | EXTENDED_RELEASE_TABLET | Freq: Once | ORAL | Status: AC
  Administered 2018-09-14: 40 meq via ORAL
  Filled 2018-09-14: qty 4

## 2018-09-14 MED ORDER — SODIUM CHLORIDE 0.9% FLUSH: 3.0000 mL | INTRAVENOUS | Status: DC | PRN

## 2018-09-14 MED ORDER — PROCHLORPERAZINE MALEATE 10 MG PO TABS
10.0000 mg | ORAL_TABLET | Freq: Four times a day (QID) | ORAL | Status: DC | PRN
  Filled 2018-09-14: qty 1

## 2018-09-14 MED ORDER — OXYCODONE HCL 5 MG PO TABS
20.0000 mg | ORAL_TABLET | Freq: Four times a day (QID) | ORAL | Status: DC | PRN
  Administered 2018-09-14 – 2018-09-15 (×4): 20 mg via ORAL
  Filled 2018-09-14 (×5): qty 4

## 2018-09-14 MED ORDER — BUDESONIDE 0.5 MG/2ML IN SUSP
0.5000 mg | Freq: Two times a day (BID) | RESPIRATORY_TRACT | Status: DC
  Administered 2018-09-14 – 2018-09-17 (×6): 0.5 mg via RESPIRATORY_TRACT
  Filled 2018-09-14 (×5): qty 2

## 2018-09-14 MED ORDER — MORPHINE SULFATE ER 15 MG PO TBCR: 15.0000 mg | EXTENDED_RELEASE_TABLET | Freq: Two times a day (BID) | ORAL | Status: DC

## 2018-09-14 MED ORDER — METHYLPREDNISOLONE SODIUM SUCC 40 MG IJ SOLR
40.0000 mg | Freq: Two times a day (BID) | INTRAMUSCULAR | Status: DC
  Administered 2018-09-14 – 2018-09-17 (×6): 40 mg via INTRAVENOUS
  Filled 2018-09-14 (×6): qty 1

## 2018-09-14 MED ORDER — MORPHINE SULFATE ER 15 MG PO TBCR
60.0000 mg | EXTENDED_RELEASE_TABLET | Freq: Two times a day (BID) | ORAL | Status: DC
  Administered 2018-09-14 – 2018-09-17 (×6): 60 mg via ORAL
  Filled 2018-09-14 (×6): qty 4

## 2018-09-14 MED ORDER — MAGIC MOUTHWASH
5.0000 mL | Freq: Four times a day (QID) | ORAL | Status: DC
  Administered 2018-09-14 – 2018-09-17 (×12): 5 mL via ORAL
  Filled 2018-09-14 (×18): qty 10

## 2018-09-14 MED ORDER — MORPHINE SULFATE ER 15 MG PO TBCR
60.0000 mg | EXTENDED_RELEASE_TABLET | Freq: Two times a day (BID) | ORAL | Status: DC
  Administered 2018-09-14: 10:00:00 60 mg via ORAL
  Filled 2018-09-14: qty 4

## 2018-09-14 MED ORDER — AZITHROMYCIN 500 MG PO TABS
500.0000 mg | ORAL_TABLET | Freq: Every day | ORAL | Status: AC
  Administered 2018-09-14: 15:00:00 500 mg via ORAL
  Filled 2018-09-14: qty 1

## 2018-09-14 MED ORDER — AZITHROMYCIN 500 MG PO TABS
250.0000 mg | ORAL_TABLET | Freq: Every day | ORAL | Status: DC
  Administered 2018-09-15 – 2018-09-17 (×3): 250 mg via ORAL
  Filled 2018-09-14 (×3): qty 1

## 2018-09-14 MED ORDER — SODIUM CHLORIDE 0.9% FLUSH
3.0000 mL | Freq: Two times a day (BID) | INTRAVENOUS | Status: DC
  Administered 2018-09-14 – 2018-09-17 (×8): 3 mL via INTRAVENOUS

## 2018-09-14 MED ORDER — IPRATROPIUM-ALBUTEROL 0.5-2.5 (3) MG/3ML IN SOLN
3.0000 mL | Freq: Four times a day (QID) | RESPIRATORY_TRACT | Status: DC
  Administered 2018-09-14 – 2018-09-17 (×9): 3 mL via RESPIRATORY_TRACT
  Filled 2018-09-14 (×9): qty 3

## 2018-09-14 NOTE — Progress Notes (Signed)
Patient ID: Isaac Cooper, male   DOB: [birth year], 58 y.o.   MRN: [id number]  [facility] PROGRESS NOTE  Isaac Cooper XNA:[id number] DOB: [birth year] DOA: 09/13/2018 PCP: Cletis Athens, MD  HPI/Subjective: Patient came in with shortness of breath.  He is feeling a little bit better with regards to his breathing.  He has been coughing up some phlegm.  He states he can eat unless he has his pain medications.  He does not wear oxygen at home.  He stated he finished radiation and chemotherapy and is due to get his next immunotherapy on Monday.  Objective: Vitals:   09/14/18 0507 09/14/18 0746  BP: 123/69 128/70  Pulse: 86 88  Resp: 20 20  Temp: 98.3 F (36.8 C) 97.6 F (36.4 C)  SpO2: 97% 96%    Filed Weights   09/13/18 2020 09/13/18 2321  Weight: 54.4 kg 56.4 kg    ROS: Review of Systems  Constitutional: Negative for chills and fever.  Eyes: Negative for blurred vision.  Respiratory: Positive for cough and shortness of breath.   Cardiovascular: Negative for chest pain.  Gastrointestinal: Negative for abdominal pain, constipation, diarrhea, nausea and vomiting.  Genitourinary: Negative for dysuria.  Musculoskeletal: Positive for joint pain.  Neurological: Negative for dizziness and headaches.   Exam: Physical Exam  Constitutional: He is oriented to person, place, and time.  HENT:  Nose: No mucosal edema.  Mouth/Throat: No oropharyngeal exudate or posterior oropharyngeal edema.  Eyes: Pupils are equal, round, and reactive to light. Conjunctivae, EOM and lids are normal.  Pale conjunctiva  Neck: No JVD present. Carotid bruit is not present. No edema present. No thyroid mass and no thyromegaly present.  Cardiovascular: S1 normal and S2 normal. Exam reveals no gallop.  No murmur heard. Pulses:      Dorsalis pedis pulses are 2+ on the right side and 2+ on the left side.  Respiratory: No respiratory distress. He has decreased breath sounds in the right middle field, the  right lower field, the left middle field and the left lower field. He has wheezes in the right middle field and the left middle field. He has rhonchi in the right lower field and the left lower field. He has no rales.  GI: Soft. Bowel sounds are normal. There is no abdominal tenderness.  Musculoskeletal:     Right ankle: He exhibits no swelling.     Left ankle: He exhibits no swelling.  Lymphadenopathy:    He has no cervical adenopathy.  Neurological: He is alert and oriented to person, place, and time. No cranial nerve deficit.  Skin: Skin is warm. No rash noted. Nails show no clubbing.  Psychiatric: He has a normal mood and affect.      Data Reviewed: Basic Metabolic Panel: Recent Labs  Lab 09/13/18 2017 09/13/18 2350  NA 133* 135  K 3.3* 3.0*  CL 96* 101  CO2 26 25  GLUCOSE 149* 177*  BUN 14 12  CREATININE 0.47* 0.59*  CALCIUM 7.9* 7.6*   CBC: Recent Labs  Lab 09/13/18 2017 09/13/18 2350  WBC 14.0* 5.0  NEUTROABS 10.7*  --   HGB 8.3* 7.3*  HCT 27.6* 24.2*  MCV 91.7 91.7  PLT 295 184   Cardiac Enzymes: Recent Labs  Lab 09/13/18 2017  TROPONINI <0.03   BNP (last 3 results) Recent Labs    09/13/18 2017  BNP 174.0*      Recent Results (from the past 240 hour(s))  Blood culture (routine x 2)  Status: None (Preliminary result)   Collection Time: 09/13/18  9:28 PM  Result Value Ref Range Status   Specimen Description BLOOD BLOOD RIGHT FOREARM  Final   Special Requests   Final    BOTTLES DRAWN AEROBIC AND ANAEROBIC Blood Culture results may not be optimal due to an excessive volume of blood received in culture bottles   Culture   Final    NO GROWTH < 12 HOURS Performed at [facility], [address] [place], [place] [address]    Report Status PENDING  Incomplete  Blood culture (routine x 2)     Status: None (Preliminary result)   Collection Time: 09/13/18  9:28 PM  Result Value Ref Range Status   Specimen Description BLOOD BLOOD LEFT  FOREARM  Final   Special Requests   Final    BOTTLES DRAWN AEROBIC AND ANAEROBIC Blood Culture adequate volume   Culture   Final    NO GROWTH < 12 HOURS Performed at [facility], [address], [place], [place] [address]    Report Status PENDING  Incomplete  Expectorated sputum assessment w rflx to resp cult     Status: None   Collection Time: 09/14/18 11:23 AM  Result Value Ref Range Status   Specimen Description SPUTUM  Final   Special Requests Normal  Final   Sputum evaluation   Final    THIS SPECIMEN IS ACCEPTABLE FOR SPUTUM CULTURE Performed at [facility], [address] [place], [place] [address]    Report Status 09/14/2018 FINAL  Final     Studies: Dg Chest Port 1 View  Result Date: 09/13/2018 CLINICAL DATA:  Difficulty breathing today. Stage III lung cancer. Productive cough. EXAM: PORTABLE CHEST 1 VIEW COMPARISON:  None. FINDINGS: The heart size is normal. Right pleural effusion is present. Interstitial and airspace disease superimposed on chronic lung disease. Emphysematous changes are noted. No discrete mass lesion is present. IMPRESSION: 1. Asymmetric right lower lobe airspace disease. This is concerning for acute edema or infection superimposed on chronic interstitial lung disease. 2. Asymmetric right pleural effusion. Electronically Signed   By: San Morelle M.D.   On: 09/13/2018 20:36    Scheduled Meds: . budesonide (PULMICORT) nebulizer solution  0.5 mg Nebulization BID  . enoxaparin (LOVENOX) injection  40 mg Subcutaneous Q24H  . ipratropium-albuterol  3 mL Nebulization Q6H  . magic mouthwash  5 mL Oral QID  . methylPREDNISolone (SOLU-MEDROL) injection  40 mg Intravenous Q12H  . morphine  60 mg Oral Q12H  . potassium chloride  40 mEq Oral Once  . sodium chloride flush  3 mL Intravenous Q12H   Continuous Infusions: . ceFEPime (MAXIPIME) IV 200 mL/hr at 09/14/18 1300  . vancomycin      Assessment/Plan:  1. Clinical sepsis with  right lower lobe pneumonia.  Patient started on Maxipime and vancomycin.  Will get MRSA PCR.  There was toxic granulation on CBC.  So far blood cultures are negative.  Obtain a sputum culture.  Add oral Zithromax. 2. Acute hypoxic respiratory failure.  Patient does not wear oxygen at home.  Patient on 6 L here.  Will try to taper down. 3. COPD exacerbation on Solu-Medrol.  Put standing dose nebulizers. 4. Lung cancer.  We will have the follow-up at [facility] after discharge.  They will likely reschedule his immunotherapy 5. Anemia.  Likely of chronic disease.  Check a guaiac and ferritin. 6. Hypokalemia replace potassium 7. Chronic pain on high-dose MS Contin and oxycodone  Code Status:     Code Status Orders  (From admission, onward)         Start     Ordered   09/13/18 2319  Full code  Continuous     09/13/18 2318        Code Status History    This patient has a current code status but no historical code status.    Advance Directive Documentation     Most Recent Value  Type of Advance Directive  Healthcare Power of Attorney, Living will  Pre-existing out of facility DNR order (yellow form or pink MOST form)  -  "MOST" Form in Place?  -     Family Communication: Spoke with family at the bedside Disposition Plan: Hopefully will be able to get off oxygen prior to disposition  Antibiotics:  Vancomycin  Cefepime  Added Zithromax  Time spent: 28 minutes  [place]

## 2018-09-14 NOTE — Progress Notes (Signed)
Reports he uses his oxycodone with meals due to painful swallowing. MD notified of home pain regiment with orders obtained. Flu swab neg. MRSA neg. Congested productive cough with sputum sent to lab. No stools today with pt educated on hemoccult. Pt has limited memory recall at intervals regarding meds, use, doses/frequency. IV antibiotics continued. Pain 2/10 with excerbations to "8" with swallow. Magic mouthwash given closer to meals with education done.

## 2018-09-14 NOTE — Plan of Care (Signed)
  Problem: Education: Goal: Knowledge of General Education information will improve Description Including pain rating scale, medication(s)/side effects and non-pharmacologic comfort measures Outcome: Progressing   Problem: Health Behavior/Discharge Planning: Goal: Ability to manage health-related needs will improve Outcome: Progressing   Problem: Clinical Measurements: Goal: Ability to maintain clinical measurements within normal limits will improve Outcome: Progressing Goal: Will remain free from infection Outcome: Progressing Goal: Respiratory complications will improve Outcome: Progressing   Problem: Activity: Goal: Risk for activity intolerance will decrease Outcome: Progressing   Problem: Nutrition: Goal: Adequate nutrition will be maintained Outcome: Progressing   Problem: Pain Managment: Goal: General experience of comfort will improve Outcome: Progressing   Problem: Safety: Goal: Ability to remain free from injury will improve Outcome: Progressing

## 2018-09-15 MED ORDER — FLUCONAZOLE 100 MG PO TABS
100.0000 mg | ORAL_TABLET | Freq: Every day | ORAL | Status: DC
  Administered 2018-09-15 – 2018-09-17 (×3): 100 mg via ORAL
  Filled 2018-09-15 (×3): qty 1

## 2018-09-15 NOTE — Progress Notes (Signed)
Patient ID: Isaac [facility], male   DOB: [birth year], 59 y.o.   MRN: [id number]  [facility] PROGRESS NOTE  Isaac Cooper [id number]:[id number] DOB: 1961/04/08 DOA: 09/13/2018 PCP: Cletis Athens, MD  HPI/Subjective: Patient states he is breathing a little bit better.  Still with cough and shortness of breath.   Objective: Vitals:   09/15/18 1111 09/15/18 1300  BP:    Pulse: 93 93  Resp: 20   Temp:    SpO2: 95% 95%    Filed Weights   09/13/18 2020 09/13/18 2321  Weight: 54.4 kg 56.4 kg    ROS: Review of Systems  Constitutional: Negative for chills and fever.  Eyes: Negative for blurred vision.  Respiratory: Positive for cough and shortness of breath.   Cardiovascular: Negative for chest pain.  Gastrointestinal: Negative for abdominal pain, constipation, diarrhea, nausea and vomiting.  Genitourinary: Negative for dysuria.  Musculoskeletal: Positive for joint pain.  Neurological: Negative for dizziness and headaches.   Exam: Physical Exam  Constitutional: He is oriented to person, place, and time.  HENT:  Nose: No mucosal edema.  Mouth/Throat: No oropharyngeal exudate or posterior oropharyngeal edema.  Eyes: Pupils are equal, round, and reactive to light. Conjunctivae, EOM and lids are normal.  Pale conjunctiva  Neck: No JVD present. Carotid bruit is not present. No edema present. No thyroid mass and no thyromegaly present.  Cardiovascular: S1 normal and S2 normal. Exam reveals no gallop.  No murmur heard. Pulses:      Dorsalis pedis pulses are 2+ on the right side and 2+ on the left side.  Respiratory: No respiratory distress. He has decreased breath sounds in the right middle field, the right lower field, the left middle field and the left lower field. He has wheezes in the right middle field and the left middle field. He has rhonchi in the right lower field and the left lower field. He has no rales.  GI: Soft. Bowel sounds are normal. There is no abdominal tenderness.   Musculoskeletal:     Right ankle: He exhibits no swelling.     Left ankle: He exhibits no swelling.  Lymphadenopathy:    He has no cervical adenopathy.  Neurological: He is alert and oriented to person, place, and time. No cranial nerve deficit.  Skin: Skin is warm. No rash noted. Nails show no clubbing.  Psychiatric: He has a normal mood and affect.      Data Reviewed: Basic Metabolic Panel: Recent Labs  Lab 09/13/18 2017 09/13/18 2350  NA 133* 135  K 3.3* 3.0*  CL 96* 101  CO2 26 25  GLUCOSE 149* 177*  BUN 14 12  CREATININE 0.47* 0.59*  CALCIUM 7.9* 7.6*   CBC: Recent Labs  Lab 09/13/18 2017 09/13/18 2350  WBC 14.0* 5.0  NEUTROABS 10.7*  --   HGB 8.3* 7.3*  HCT 27.6* 24.2*  MCV 91.7 91.7  PLT 295 184   Cardiac Enzymes: Recent Labs  Lab 09/13/18 2017  TROPONINI <0.03   BNP (last 3 results) Recent Labs    09/13/18 2017  BNP 174.0*      Recent Results (from the past 240 hour(s))  Blood culture (routine x 2)     Status: None (Preliminary result)   Collection Time: 09/13/18  9:28 PM  Result Value Ref Range Status   Specimen Description BLOOD BLOOD RIGHT FOREARM  Final   Special Requests   Final    BOTTLES DRAWN AEROBIC AND ANAEROBIC Blood Culture results may not be optimal due  to an excessive volume of blood received in culture bottles   Culture   Final    NO GROWTH 2 DAYS Performed at [facility], [address] [place], [place] [address]    Report Status PENDING  Incomplete  Blood culture (routine x 2)     Status: None (Preliminary result)   Collection Time: 09/13/18  9:28 PM  Result Value Ref Range Status   Specimen Description BLOOD BLOOD LEFT FOREARM  Final   Special Requests   Final    BOTTLES DRAWN AEROBIC AND ANAEROBIC Blood Culture adequate volume   Culture   Final    NO GROWTH 2 DAYS Performed at [facility], [address], [place], [place] [address]    Report Status PENDING  Incomplete  Expectorated sputum  assessment w rflx to resp cult     Status: None   Collection Time: 09/14/18 11:23 AM  Result Value Ref Range Status   Specimen Description SPUTUM  Final   Special Requests Normal  Final   Sputum evaluation   Final    THIS SPECIMEN IS ACCEPTABLE FOR SPUTUM CULTURE Performed at [facility], [address], [place], [place] [address]    Report Status 09/14/2018 FINAL  Final  Culture, respiratory     Status: None (Preliminary result)   Collection Time: 09/14/18 11:23 AM  Result Value Ref Range Status   Specimen Description   Final    SPUTUM Performed at [facility], [address] [place], [place] [address]    Special Requests   Final    Normal Reflexed from [id number] Performed at [facility], [address] [place], [place] [address]    Gram Stain   Final    FEW WBC PRESENT, PREDOMINANTLY PMN RARE SQUAMOUS EPITHELIAL CELLS PRESENT FEW GRAM POSITIVE COCCI IN PAIRS    Culture   Final    CULTURE REINCUBATED FOR BETTER GROWTH Performed at [facility], [place] [address] [place], [place] [address]    Report Status PENDING  Incomplete  MRSA PCR Screening     Status: None   Collection Time: 09/14/18  2:52 PM  Result Value Ref Range Status   MRSA by PCR NEGATIVE NEGATIVE Final    Comment:        The GeneXpert MRSA Assay (FDA approved for NASAL specimens only), is one component of a comprehensive MRSA colonization surveillance program. It is not intended to diagnose MRSA infection nor to guide or monitor treatment for MRSA infections. Performed at [facility], [address], [place], [place] [address]      Studies: Dg Chest [facility] 1 View  Result Date: 09/13/2018 CLINICAL DATA:  Difficulty breathing today. Stage III lung cancer. Productive cough. EXAM: PORTABLE CHEST 1 VIEW COMPARISON:  None. FINDINGS: The heart size is normal. Right pleural effusion is present. Interstitial and airspace disease superimposed on chronic lung disease.  Emphysematous changes are noted. No discrete mass lesion is present. IMPRESSION: 1. Asymmetric right lower lobe airspace disease. This is concerning for acute edema or infection superimposed on chronic interstitial lung disease. 2. Asymmetric right pleural effusion. Electronically Signed   By: San Morelle M.D.   On: 09/13/2018 20:36    Scheduled Meds: . azithromycin  250 mg Oral Daily  . budesonide (PULMICORT) nebulizer solution  0.5 mg Nebulization BID  . enoxaparin (LOVENOX) injection  40 mg Subcutaneous Q24H  . fluconazole  100 mg Oral Daily  . ipratropium-albuterol  3 mL Nebulization Q6H  .  magic mouthwash  5 mL Oral QID  . methylPREDNISolone (SOLU-MEDROL) injection  40 mg Intravenous Q12H  . morphine  60 mg Oral Q12H  . sodium chloride flush  3 mL Intravenous Q12H   Continuous Infusions: . ceFEPime (MAXIPIME) IV 200 mL/hr at 09/15/18 1255    Assessment/Plan:  1. Clinical sepsis with right lower lobe pneumonia.  There was toxic granulation on CBC.  So far blood cultures are negative.  Sputum culture pending.  Since MRSA PCR was negative vancomycin discontinued.  Patient on Maxipime and Zithromax. 2. Acute hypoxic respiratory failure.  Patient does not wear oxygen at home.  Patient on 6 L this morning and now down to 3.5 L.  Continue to taper oxygen.  Patient still with a lot of bronchospasm. 3. COPD exacerbation on Solu-Medrol.  Put standing dose nebulizers. 4. Lung cancer.  He will have the follow-up at [facility] after discharge.  They will likely reschedule his immunotherapy. 5. Anemia.  Likely of chronic disease.  Check a guaiac and ferritin. 6. Hypokalemia replace potassium 7. Chronic pain on high-dose MS Contin and oxycodone 8. Thrush.  Start Diflucan  Code Status:     Code Status Orders  (From admission, onward)         Start     Ordered   09/13/18 2319  Full code  Continuous     09/13/18 2318        Code Status History    This patient has a current code  status but no historical code status.    Advance Directive Documentation     Most Recent Value  Type of Advance Directive  Healthcare Power of Attorney, Living will  Pre-existing out of facility DNR order (yellow form or pink MOST form)  -  "MOST" Form in Place?  -     Family Communication: Spoke with family at the bedside Disposition Plan: Likely will need a few more days of treatment in the hospital  Antibiotics:  Cefepime  Zithromax  Time spent: 29 minutes  [place]

## 2018-09-15 NOTE — Progress Notes (Signed)
Noticeable and reported improvement in mental status/respiratory status. Less congested cough with sputum production less. and dysnea with exertion. Tapering 02 with tolerating 3l/[place] currently. States feels much better.  Pain controlled with pain excerbation with swallowing food with oxycodone prn x 2 and magic mouthwash. Diflucan po given. Wife in and other visitors.  No BM today- pt educated on hemoccult stool collection. IV and po antibiotics continued.

## 2018-09-15 NOTE — Progress Notes (Signed)
Pulse ox 89 % on 3l/nl 02- will hold. Pt and wife advised to notify nurse of any respiratory changes.

## 2018-09-16 LAB — BASIC METABOLIC PANEL
Anion gap: 5 (ref 5–15)
BUN: 19 mg/dL (ref 6–20)
CO2: 29 mmol/L (ref 22–32)
Calcium: 8.2 mg/dL — ABNORMAL LOW (ref 8.9–10.3)
Chloride: 105 mmol/L (ref 98–111)
Creatinine, Ser: 0.45 mg/dL — ABNORMAL LOW (ref 0.61–1.24)
GFR calc Af Amer: >60 mL/min (ref >60)
GFR calc non Af Amer: >60 mL/min (ref >60)
Glucose, Bld: 142 mg/dL — ABNORMAL HIGH (ref 70–99)
Potassium: 3.8 mmol/L (ref 3.5–5.1)
Sodium: 139 mmol/L (ref 135–145)

## 2018-09-16 LAB — PREPARE RBC (CROSSMATCH)

## 2018-09-16 LAB — MAGNESIUM: Magnesium: 2 mg/dL (ref 1.7–2.4)

## 2018-09-16 LAB — CBC
HCT: 22.7 % — ABNORMAL LOW (ref 39.0–52.0)
Hemoglobin: 6.7 g/dL — ABNORMAL LOW (ref 13.0–17.0)
MCH: 27.8 pg (ref 26.0–34.0)
MCHC: 29.5 g/dL — ABNORMAL LOW (ref 30.0–36.0)
MCV: 94.2 fL (ref 80.0–100.0)
Platelets: 158 10*3/uL (ref 150–400)
RBC: 2.41 MIL/uL — ABNORMAL LOW (ref 4.22–5.81)
RDW: 17.7 % — ABNORMAL HIGH (ref 11.5–15.5)
WBC: 5.8 10*3/uL (ref 4.0–10.5)
nRBC: 0 % (ref 0.0–0.2)

## 2018-09-16 LAB — ABO/RH: ABO/RH(D): A POS

## 2018-09-16 LAB — FERRITIN: Ferritin: 555 ng/mL — ABNORMAL HIGH (ref 24–336)

## 2018-09-16 MED ORDER — SODIUM CHLORIDE 0.9% IV SOLUTION
Freq: Once | INTRAVENOUS | Status: AC
  Administered 2018-09-16: 10:00:00 via INTRAVENOUS

## 2018-09-16 MED ORDER — FUROSEMIDE 10 MG/ML IJ SOLN
20.0000 mg | Freq: Once | INTRAMUSCULAR | Status: AC
  Administered 2018-09-16: 20 mg via INTRAVENOUS
  Filled 2018-09-16: qty 2

## 2018-09-16 MED ORDER — ACETAMINOPHEN 325 MG PO TABS
650.0000 mg | ORAL_TABLET | Freq: Once | ORAL | Status: AC
  Administered 2018-09-16: 10:00:00 650 mg via ORAL
  Filled 2018-09-16: qty 2

## 2018-09-16 MED ORDER — OXYCODONE HCL 5 MG PO TABS
20.0000 mg | ORAL_TABLET | Freq: Four times a day (QID) | ORAL | Status: DC | PRN
  Administered 2018-09-16 (×2): 20 mg via ORAL
  Filled 2018-09-16: qty 4

## 2018-09-16 NOTE — Progress Notes (Signed)
Patient ID: Isaac Cooper, male   DOB: 1960-09-30, 58 y.o.   MRN: [id number]  [facility] PROGRESS NOTE  Isaac Cooper [id number] DOB: 14-Nov-1960 DOA: 09/13/2018 PCP: Isaac Athens, MD  HPI/Subjective: Patient feeling a little bit better.  Still with some cough and shortness of breath and wheeze.  Objective: Vitals:   09/16/18 1401 09/16/18 1410  BP:  132/80  Pulse: 87 92  Resp: 18 17  Temp:  (!) 97.5 F (36.4 C)  SpO2: 93% 92%    Filed Weights   09/13/18 2020 09/13/18 2321  Weight: 54.4 kg 56.4 kg    ROS: Review of Systems  Constitutional: Negative for chills and fever.  Eyes: Negative for blurred vision.  Respiratory: Positive for cough, shortness of breath and wheezing.   Cardiovascular: Negative for chest pain.  Gastrointestinal: Negative for abdominal pain, constipation, diarrhea, nausea and vomiting.  Genitourinary: Negative for dysuria.  Musculoskeletal: Positive for joint pain.  Neurological: Negative for dizziness and headaches.   Exam: Physical Exam  Constitutional: He is oriented to person, place, and time.  HENT:  Nose: No mucosal edema.  Mouth/Throat: No oropharyngeal exudate or posterior oropharyngeal edema.  Eyes: Pupils are equal, round, and reactive to light. Conjunctivae, EOM and lids are normal.  Pale conjunctiva  Neck: No JVD present. Carotid bruit is not present. No edema present. No thyroid mass and no thyromegaly present.  Cardiovascular: S1 normal and S2 normal. Exam reveals no gallop.  No murmur heard. Pulses:      Dorsalis pedis pulses are 2+ on the right side and 2+ on the left side.  Respiratory: No respiratory distress. He has decreased breath sounds in the right middle field, the right lower field, the left middle field and the left lower field. He has wheezes in the right middle field and the left middle field. He has rhonchi in the right lower field and the left lower field. He has no rales.  GI: Soft. Bowel sounds are normal.  There is no abdominal tenderness.  Musculoskeletal:     Right ankle: He exhibits no swelling.     Left ankle: He exhibits no swelling.  Lymphadenopathy:    He has no cervical adenopathy.  Neurological: He is alert and oriented to person, place, and time. No cranial nerve deficit.  Skin: Skin is warm. No rash noted. Nails show no clubbing.  Psychiatric: He has a normal mood and affect.      Data Reviewed: Basic Metabolic Panel: Recent Labs  Lab 09/13/18 2017 09/13/18 2350 09/16/18 0431  NA 133* 135 139  K 3.3* 3.0* 3.8  CL 96* 101 105  CO2 26 25 29   GLUCOSE 149* 177* 142*  BUN 14 12 19   CREATININE 0.47* 0.59* 0.45*  CALCIUM 7.9* 7.6* 8.2*  MG  --   --  2.0   CBC: Recent Labs  Lab 09/13/18 2017 09/13/18 2350 09/16/18 0431  WBC 14.0* 5.0 5.8  NEUTROABS 10.7*  --   --   HGB 8.3* 7.3* 6.7*  HCT 27.6* 24.2* 22.7*  MCV 91.7 91.7 94.2  PLT 295 184 158   Cardiac Enzymes: Recent Labs  Lab 09/13/18 2017  TROPONINI <0.03   BNP (last 3 results) Recent Labs    09/13/18 2017  BNP 174.0*      Recent Results (from the past 240 hour(s))  Blood culture (routine x 2)     Status: None (Preliminary result)   Collection Time: 09/13/18  9:28 PM  Result Value Ref Range Status  Specimen Description BLOOD BLOOD RIGHT FOREARM  Final   Special Requests   Final    BOTTLES DRAWN AEROBIC AND ANAEROBIC Blood Culture results may not be optimal due to an excessive volume of blood received in culture bottles   Culture   Final    NO GROWTH 3 DAYS Performed at [facility], [address], [place], [place] [address]    Report Status PENDING  Incomplete  Blood culture (routine x 2)     Status: None (Preliminary result)   Collection Time: 09/13/18  9:28 PM  Result Value Ref Range Status   Specimen Description BLOOD BLOOD LEFT FOREARM  Final   Special Requests   Final    BOTTLES DRAWN AEROBIC AND ANAEROBIC Blood Culture adequate volume   Culture   Final    NO GROWTH 3  DAYS Performed at [facility], [address] [place], [place] [address]    Report Status PENDING  Incomplete  Expectorated sputum assessment w rflx to resp cult     Status: None   Collection Time: 09/14/18 11:23 AM  Result Value Ref Range Status   Specimen Description SPUTUM  Final   Special Requests Normal  Final   Sputum evaluation   Final    THIS SPECIMEN IS ACCEPTABLE FOR SPUTUM CULTURE Performed at [facility], [address], [place], [place] [address]    Report Status 09/14/2018 FINAL  Final  Culture, respiratory     Status: None (Preliminary result)   Collection Time: 09/14/18 11:23 AM  Result Value Ref Range Status   Specimen Description   Final    SPUTUM Performed at [facility], [address] [place], [place] [address]    Special Requests   Final    Normal Reflexed from [id number] Performed at [facility], [address] [place], [place] [address]    Gram Stain   Final    FEW WBC PRESENT, PREDOMINANTLY PMN RARE SQUAMOUS EPITHELIAL CELLS PRESENT FEW GRAM POSITIVE COCCI IN PAIRS    Culture   Final    MODERATE Consistent with normal respiratory flora. Performed at [facility], [place] [address] [place], [place] [address]    Report Status PENDING  Incomplete  MRSA PCR Screening     Status: None   Collection Time: 09/14/18  2:52 PM  Result Value Ref Range Status   MRSA by PCR NEGATIVE NEGATIVE Final    Comment:        The GeneXpert MRSA Assay (FDA approved for NASAL specimens only), is one component of a comprehensive MRSA colonization surveillance program. It is not intended to diagnose MRSA infection nor to guide or monitor treatment for MRSA infections. Performed at [facility], [address], [place], [place] [address]      Studies: No results found.  Scheduled Meds: . azithromycin  250 mg Oral Daily  . budesonide (PULMICORT) nebulizer solution  0.5 mg Nebulization BID  . enoxaparin  (LOVENOX) injection  40 mg Subcutaneous Q24H  . fluconazole  100 mg Oral Daily  . ipratropium-albuterol  3 mL Nebulization Q6H  . magic mouthwash  5 mL Oral QID  . methylPREDNISolone (SOLU-MEDROL) injection  40 mg Intravenous Q12H  . morphine  60 mg Oral Q12H  . sodium chloride flush  3 mL Intravenous Q12H   Continuous Infusions: . ceFEPime (MAXIPIME) IV 2 g (09/16/18 1312)    Assessment/Plan:  1. Clinical sepsis with right lower lobe pneumonia.  There was toxic granulation on  CBC.  So far blood cultures are negative.  Sputum culture so far shows normal respiratory flora.  Since MRSA PCR was negative vancomycin discontinued.  Patient on Maxipime and Zithromax. 2. Acute hypoxic respiratory failure.  Patient does not wear oxygen at home.  Patient on 6 L yesterday morning and now down to 3 L.  This afternoon now on room air. 3. COPD exacerbation on Solu-Medrol.  Continue standing dose nebulizers. 4. Lung cancer.  He will have the follow-up at [facility] after discharge.  They will likely reschedule his immunotherapy. 5. Anemia of chronic disease.  Ferritin elevated.  Hemoglobin 6.7 today.  Transfuse 1 unit of packed red blood cells today.  Benefits and risks of transfusion explained to patient.  Patient has had transfusions before in the past. 6. Hypokalemia replaced 7. Chronic pain on high-dose MS Contin and oxycodone 8. Thrush.  Diflucan  Code Status:     Code Status Orders  (From admission, onward)         Start     Ordered   09/13/18 2319  Full code  Continuous     09/13/18 2318        Code Status History    This patient has a current code status but no historical code status.    Advance Directive Documentation     Most Recent Value  Type of Advance Directive  Healthcare Power of Attorney, Living will  Pre-existing out of facility DNR order (yellow form or pink MOST form)  -  "MOST" Form in Place?  -     Family Communication: Spoke with family at the bedside  yesterday Disposition Plan: At this point reevaluate daily on when to go home.  Antibiotics:  Cefepime  Zithromax  Time spent: 27 minutes  Seltzer

## 2018-09-16 NOTE — Progress Notes (Signed)
PT Cancellation Note  Patient Details Name: Isaac Cooper MRN: [id number] DOB: 09-16-1960   Cancelled Treatment:    Reason Eval/Treat Not Completed: Patient not medically ready.  PT consult received.  Pt's Hgb noted to be down-trending to 6.7 today with orders for RBC transfusion.  Per PT guidelines for Hgb <7.1, pt currently contraindicated for PT.  Will re-attempt PT evaluation at a later date/time as medically appropriate.  Leitha Bleak, PT 09/16/18, 11:10 AM [phone]

## 2018-09-17 LAB — CBC
HCT: 27 % — ABNORMAL LOW (ref 39.0–52.0)
HEMOGLOBIN: 8.2 g/dL — AB (ref 13.0–17.0)
MCH: 27.6 pg (ref 26.0–34.0)
MCHC: 30.4 g/dL (ref 30.0–36.0)
MCV: 90.9 fL (ref 80.0–100.0)
Platelets: 176 10*3/uL (ref 150–400)
RBC: 2.97 MIL/uL — ABNORMAL LOW (ref 4.22–5.81)
RDW: 18.1 % — ABNORMAL HIGH (ref 11.5–15.5)
WBC: 5.5 10*3/uL (ref 4.0–10.5)
nRBC: 0 % (ref 0.0–0.2)

## 2018-09-17 LAB — BPAM RBC
Blood Product Expiration Date: 202003112359
ISSUE DATE / TIME: 202003091058
Unit Type and Rh: 600

## 2018-09-17 LAB — TYPE AND SCREEN
ABO/RH(D): A POS
ANTIBODY SCREEN: NEGATIVE
UNIT DIVISION: 0

## 2018-09-17 LAB — HIV ANTIBODY (ROUTINE TESTING W REFLEX): HIV Screen 4th Generation wRfx: NONREACTIVE

## 2018-09-17 LAB — CULTURE, RESPIRATORY W GRAM STAIN
Culture: NORMAL
Special Requests: NORMAL

## 2018-09-17 MED ORDER — FLUCONAZOLE 100 MG PO TABS: 100.0000 mg | ORAL_TABLET | Freq: Every day | ORAL | 0 refills | Status: DC

## 2018-09-17 MED ORDER — BENZONATATE 200 MG PO CAPS: 200.0000 mg | ORAL_CAPSULE | Freq: Three times a day (TID) | ORAL | 0 refills | Status: AC | PRN

## 2018-09-17 MED ORDER — ACETAMINOPHEN 325 MG PO TABS: 650.0000 mg | ORAL_TABLET | Freq: Four times a day (QID) | ORAL | Status: AC | PRN

## 2018-09-17 MED ORDER — ALBUTEROL SULFATE HFA 108 (90 BASE) MCG/ACT IN AERS: 2.0000 | INHALATION_SPRAY | Freq: Four times a day (QID) | RESPIRATORY_TRACT | 0 refills | Status: AC | PRN

## 2018-09-17 MED ORDER — PREDNISONE 10 MG (21) PO TBPK: 10.0000 mg | ORAL_TABLET | Freq: Every day | ORAL | 0 refills | Status: DC

## 2018-09-17 MED ORDER — CEFDINIR 300 MG PO CAPS: 300.0000 mg | ORAL_CAPSULE | Freq: Two times a day (BID) | ORAL | 0 refills | Status: AC

## 2018-09-17 MED ORDER — IPRATROPIUM-ALBUTEROL 0.5-2.5 (3) MG/3ML IN SOLN: 3.0000 mL | Freq: Two times a day (BID) | RESPIRATORY_TRACT | Status: DC

## 2018-09-17 MED ORDER — AZITHROMYCIN 250 MG PO TABS: 250.0000 mg | ORAL_TABLET | Freq: Every day | ORAL | 0 refills | Status: AC

## 2018-09-17 MED ORDER — GUAIFENESIN-DM 100-10 MG/5ML PO SYRP: 5.0000 mL | ORAL_SOLUTION | ORAL | 0 refills | Status: DC | PRN

## 2018-09-17 MED ORDER — ALBUTEROL SULFATE (2.5 MG/3ML) 0.083% IN NEBU: 2.5000 mg | INHALATION_SOLUTION | RESPIRATORY_TRACT | Status: DC | PRN

## 2018-09-17 NOTE — Discharge Summary (Signed)
[facility] NAME: Isaac Cooper    MR#:  [id number]  DATE OF BIRTH:  10-18-1960  DATE OF ADMISSION:  09/13/2018 ADMITTING PHYSICIAN: Lance Coon, MD  DATE OF DISCHARGE:  09/17/18   PRIMARY CARE PHYSICIAN: Cletis Athens, MD    ADMISSION DIAGNOSIS:  Acute respiratory failure with hypoxia ([place]) [J96.01] Community acquired pneumonia of right lower lobe of lung ([place]) [J18.1]  DISCHARGE DIAGNOSIS:  Principal Problem:   Sepsis ([place]) Active Problems:   Lung cancer ([place])   HCAP (healthcare-associated pneumonia)   SECONDARY DIAGNOSIS:   Past Medical History:  Diagnosis Date  . Colon polyps   . EPP (erythropoietic protoporphyria) [facility])     HOSPITAL COURSE:  HPI  Isaac Cooper  is a 58 y.o. male who presents with chief complaint as above.  Patient presents the ED with a complaint of worsening shortness of breath and purulent sputum.  Patient states he began feeling bad about 2 weeks ago, but that is gotten much worse over the last 3 days.  On evaluation in the ED is found to meet sepsis criteria with pneumonia seen on chest x-ray.  Hospitalist were called for admission  Hospital course  1. Clinical sepsis with right lower lobe pneumonia.  There was toxic granulation on CBC.  So far blood cultures are negative.  Sputum culture so far shows normal respiratory flora.  Since MRSA PCR was negative vancomycin discontinued.  Patient on Maxipime and Zithromax.  Is clinically doing fine we will discharge patient home with cefdinir and azithromycin 2. Acute hypoxic respiratory failure.  Patient does not wear oxygen at home.  Patient on 6 L yesterday morning and now down to 3 L.,now on room air.  Did not make criteria for home oxygen after ambulating with physical therapist 3. COPD exacerbation.  Clinically stable Taper steroids  Continue standing dose nebulizers. 4. Lung cancer.  He will have the follow-up at [facility] after discharge.  They will  likely reschedule his immunotherapy. 5. Anemia of chronic disease.  Ferritin elevated.  Hemoglobin 6.7 status post 1 unit of blood transfusion hemoglobin at 8.2 feeling much better today  6. Hypokalemia replaced 7. Chronic pain on high-dose MS Contin and oxycodone 8. Thrush.  Diflucan will be continued at home  Discharge patient home  DISCHARGE CONDITIONS:   Stable  CONSULTS OBTAINED:     PROCEDURES  None   DRUG ALLERGIES:  No Known Allergies  DISCHARGE MEDICATIONS:   Allergies as of 09/17/2018   No Known Allergies     Medication List    TAKE these medications   acetaminophen 325 MG tablet Commonly known as:  TYLENOL Take 2 tablets (650 mg total) by mouth every 6 (six) hours as needed for mild pain (or Fever >/= 101).   albuterol 108 (90 Base) MCG/ACT inhaler Commonly known as:  PROVENTIL HFA;VENTOLIN HFA Inhale 2 puffs into the lungs every 6 (six) hours as needed for wheezing or shortness of breath.   azithromycin 250 MG tablet Commonly known as:  ZITHROMAX Take 1 tablet (250 mg total) by mouth daily for 2 days.   benzonatate 200 MG capsule Commonly known as:  TESSALON Take 1 capsule (200 mg total) by mouth 3 (three) times daily as needed for cough.   cefdinir 300 MG capsule Commonly known as:  OMNICEF Take 1 capsule (300 mg total) by mouth 2 (two) times daily.   fluconazole 100 MG tablet Commonly known as:  DIFLUCAN Take 1 tablet (100  mg total) by mouth daily.   guaiFENesin-dextromethorphan 100-10 MG/5ML syrup Commonly known as:  ROBITUSSIN DM Take 5 mLs by mouth every 4 (four) hours as needed for cough.   magic mouthwash w/lidocaine Soln Take 5 mLs by mouth 4 (four) times daily.   morphine 60 MG 12 hr tablet Commonly known as:  MS CONTIN Take 60 mg by mouth every 12 (twelve) hours.   ondansetron 4 MG disintegrating tablet Commonly known as:  ZOFRAN-ODT Take 4 mg by mouth every 8 (eight) hours as needed for nausea or vomiting.   ondansetron 4 MG  tablet Commonly known as:  ZOFRAN Take 4 mg by mouth every 8 (eight) hours as needed for nausea or vomiting.   Oxycodone HCl 10 MG Tabs Take 20 mg by mouth every 6 (six) hours as needed (pain).   predniSONE 10 MG (21) Tbpk tablet Commonly known as:  STERAPRED UNI-PAK 21 TAB Take 1 tablet (10 mg total) by mouth daily. Take 6 tablets by mouth for 1 day followed by  5 tablets by mouth for 1 day followed by  4 tablets by mouth for 1 day followed by  3 tablets by mouth for 1 day followed by  2 tablets by mouth for 1 day followed by  1 tablet by mouth for a day and stop   prochlorperazine 10 MG tablet Commonly known as:  COMPAZINE Take 10 mg by mouth every 6 (six) hours as needed for nausea.        DISCHARGE INSTRUCTIONS:   Follow-up with primary care physician in 3 days Follow-up with oncology-primary at [facility] in 1 week DIET:  Regular diet  DISCHARGE CONDITION:  Stable  ACTIVITY:  Activity as tolerated  OXYGEN:  Home Oxygen: No.   Oxygen Delivery: room air  DISCHARGE LOCATION:  home   If you experience worsening of your admission symptoms, develop shortness of breath, life threatening emergency, suicidal or homicidal thoughts you must seek medical attention immediately by calling 911 or calling your MD immediately  if symptoms less severe.  You Must read complete instructions/literature along with all the possible adverse reactions/side effects for all the Medicines you take and that have been prescribed to you. Take any new Medicines after you have completely understood and accpet all the possible adverse reactions/side effects.   Please note  You were cared for by a hospitalist during your hospital stay. If you have any questions about your discharge medications or the care you received while you were in the hospital after you are discharged, you can call the unit and asked to speak with the hospitalist on call if the hospitalist that took care of you is not available.  Once you are discharged, your primary care physician will handle any further medical issues. Please note that NO REFILLS for any discharge medications will be authorized once you are discharged, as it is imperative that you return to your primary care physician (or establish a relationship with a primary care physician if you do not have one) for your aftercare needs so that they can reassess your need for medications and monitor your lab values.     Today  Chief Complaint  Patient presents with  . Respiratory Distress   Patient is feeling much better.  Denies any shortness of breath.  No chest tightness wants to go home  ROS:  CONSTITUTIONAL: Denies fevers, chills. Denies any fatigue, weakness.  EYES: Denies blurry vision, double vision, eye pain. EARS, NOSE, THROAT: Denies tinnitus, ear pain, hearing loss.  RESPIRATORY: Denies cough, wheeze, shortness of breath.  CARDIOVASCULAR: Denies chest pain, palpitations, edema.  GASTROINTESTINAL: Denies nausea, vomiting, diarrhea, abdominal pain. Denies bright red blood per rectum. GENITOURINARY: Denies dysuria, hematuria. ENDOCRINE: Denies nocturia or thyroid problems. HEMATOLOGIC AND LYMPHATIC: Denies easy bruising or bleeding. SKIN: Denies rash or lesion. MUSCULOSKELETAL: Denies pain in neck, back, shoulder, knees, hips or arthritic symptoms.  NEUROLOGIC: Denies paralysis, paresthesias.  PSYCHIATRIC: Denies anxiety or depressive symptoms.   VITAL SIGNS:  Blood pressure (!) 106/58, pulse (!) 105, temperature 97.6 F (36.4 C), temperature source Oral, resp. rate 18, height 5\' 10"  (1.778 m), weight 56.4 kg, SpO2 92 %.  I/O:    Intake/Output Summary (Last 24 hours) at 09/17/2018 1021 Last data filed at 09/16/2018 1830 Gross per 24 hour  Intake 877 ml  Output -  Net 877 ml    PHYSICAL EXAMINATION:  GENERAL:  58 y.o.-year-old patient lying in the bed with no acute distress.  EYES: Pupils equal, round, reactive to light and  accommodation. No scleral icterus. Extraocular muscles intact.  HEENT: Head atraumatic, normocephalic. Oropharynx and nasopharynx clear.  NECK:  Supple, no jugular venous distention. No thyroid enlargement, no tenderness.  LUNGS: Chronic bronchial breath sounds bilaterally from underlying lung cancer, no wheezing, rales,rhonchi or crepitation. No use of accessory muscles of respiration.  CARDIOVASCULAR: S1, S2 normal. No murmurs, rubs, or gallops.  ABDOMEN: Soft, non-tender, non-distended. Bowel sounds present. EXTREMITIES: No pedal edema, cyanosis, or clubbing.  NEUROLOGIC: Awake, alert and oriented x3. Sensation intact. Gait not checked.  PSYCHIATRIC: The patient is alert and oriented x 3.  SKIN: No obvious rash, lesion, or ulcer.   DATA REVIEW:   CBC Recent Labs  Lab 09/17/18 0308  WBC 5.5  HGB 8.2*  HCT 27.0*  PLT 176    Chemistries  Recent Labs  Lab 09/16/18 0431  NA 139  K 3.8  CL 105  CO2 29  GLUCOSE 142*  BUN 19  CREATININE 0.45*  CALCIUM 8.2*  MG 2.0    Cardiac Enzymes Recent Labs  Lab 09/13/18 2017  TROPONINI <0.03    Microbiology Results  Results for orders placed or performed during the hospital encounter of 09/13/18  Blood culture (routine x 2)     Status: None (Preliminary result)   Collection Time: 09/13/18  9:28 PM  Result Value Ref Range Status   Specimen Description BLOOD BLOOD RIGHT FOREARM  Final   Special Requests   Final    BOTTLES DRAWN AEROBIC AND ANAEROBIC Blood Culture results may not be optimal due to an excessive volume of blood received in culture bottles   Culture   Final    NO GROWTH 4 DAYS Performed at [facility], [address], [place], [place] [address]    Report Status PENDING  Incomplete  Blood culture (routine x 2)     Status: None (Preliminary result)   Collection Time: 09/13/18  9:28 PM  Result Value Ref Range Status   Specimen Description BLOOD BLOOD LEFT FOREARM  Final   Special Requests   Final     BOTTLES DRAWN AEROBIC AND ANAEROBIC Blood Culture adequate volume   Culture   Final    NO GROWTH 4 DAYS Performed at [facility], [address] [place], [place] [address]    Report Status PENDING  Incomplete  Expectorated sputum assessment w rflx to resp cult     Status: None   Collection Time: 09/14/18 11:23 AM  Result Value Ref Range Status   Specimen  Description SPUTUM  Final   Special Requests Normal  Final   Sputum evaluation   Final    THIS SPECIMEN IS ACCEPTABLE FOR SPUTUM CULTURE Performed at [facility], [address] [place], [place] [address]    Report Status 09/14/2018 FINAL  Final  Culture, respiratory     Status: None (Preliminary result)   Collection Time: 09/14/18 11:23 AM  Result Value Ref Range Status   Specimen Description   Final    SPUTUM Performed at [facility], [address], [place], [place] [address]    Special Requests   Final    Normal Reflexed from [id number] Performed at [facility], [address] [place], [place] [address]    Gram Stain   Final    FEW WBC PRESENT, PREDOMINANTLY PMN RARE SQUAMOUS EPITHELIAL CELLS PRESENT FEW GRAM POSITIVE COCCI IN PAIRS    Culture   Final    MODERATE Consistent with normal respiratory flora. Performed at [facility], [place] [address], [place], [place] [address]    Report Status PENDING  Incomplete  MRSA PCR Screening     Status: None   Collection Time: 09/14/18  2:52 PM  Result Value Ref Range Status   MRSA by PCR NEGATIVE NEGATIVE Final    Comment:        The GeneXpert MRSA Assay (FDA approved for NASAL specimens only), is one component of a comprehensive MRSA colonization surveillance program. It is not intended to diagnose MRSA infection nor to guide or monitor treatment for MRSA infections. Performed at [facility], [address] [place], [place] [address]     RADIOLOGY:  Dg Chest Port 1 View  Result Date: 09/13/2018 CLINICAL  DATA:  Difficulty breathing today. Stage III lung cancer. Productive cough. EXAM: PORTABLE CHEST 1 VIEW COMPARISON:  None. FINDINGS: The heart size is normal. Right pleural effusion is present. Interstitial and airspace disease superimposed on chronic lung disease. Emphysematous changes are noted. No discrete mass lesion is present. IMPRESSION: 1. Asymmetric right lower lobe airspace disease. This is concerning for acute edema or infection superimposed on chronic interstitial lung disease. 2. Asymmetric right pleural effusion. Electronically Signed   By: San Morelle M.D.   On: 09/13/2018 20:36    EKG:   Orders placed or performed during the hospital encounter of 09/13/18  . ED EKG  . ED EKG  . EKG 12-Lead  . EKG 12-Lead      Management plans discussed with the patient, he is  in agreement.  CODE STATUS:     Code Status Orders  (From admission, onward)         Start     Ordered   09/13/18 2319  Full code  Continuous     09/13/18 2318        Code Status History    This patient has a current code status but no historical code status.    Advance Directive Documentation     Most Recent Value  Type of Advance Directive  Healthcare Power of Attorney, Living will  Pre-existing out of facility DNR order (yellow form or pink MOST form)  -  "MOST" Form in Place?  -      TOTAL TIME TAKING CARE OF THIS PATIENT: 43  minutes.   Note: This dictation was prepared with Dragon dictation along with smaller phrase technology. Any transcriptional errors that result from this process are unintentional.   @[facility] @  on 09/17/2018 at 10:21 AM  Between  7am to 6pm - Pager - [phone]  After 6pm go to [email or web address] - password [email or web address] [facility]  Office  [phone]  CC: Primary care physician; Cletis Athens, MD

## 2018-09-17 NOTE — Evaluation (Signed)
Physical Therapy Evaluation Patient Details Name: Isaac Cooper MRN: [id number] DOB: October 02, 1960 Today's Date: 09/17/2018   History of Present Illness  Pt is a 58 y.o. male presenting to hospital with respiratory distress and purulent sputum.  Pt admitted with HCAP, sepsis, acute hypoxic respiratory failure, COPD exacerbation, and anemia (s/p 1 unit PRBC transfusion 09/16/18).  PMH includes stage 3 lung CA, EPP, back surgery, umbilical hernia, and chronic pain.  Clinical Impression  Prior to hospital admission, pt was independent; no home O2.  Pt lives with his wife in 1 level home with ramp to enter.  Currently pt is independent with bed mobility, transfers, and ambulation around nursing loop (no AD).  No loss of balance noted with dynamic standing/walking activities.  O2 sats 92% at rest beginning of session; 89-90% post ambulation (mild SOB noted); and O2 sats increased back to 92% on room air within a couple minutes sitting rest break (nurse and MD notified).  No acute PT needs identified.  Will discharge pt from PT in house and complete current PT order.    Follow Up Recommendations No PT follow up    Equipment Recommendations  None recommended by PT    Recommendations for Other Services       Precautions / Restrictions Precautions Precautions: Fall Restrictions Weight Bearing Restrictions: No      Mobility  Bed Mobility Overal bed mobility: Independent             General bed mobility comments: No difficulties noted  Transfers Overall transfer level: Independent Equipment used: None             General transfer comment: steady strong transfers noted  Ambulation/Gait Ambulation/Gait assistance: Independent Gait Distance (Feet): 300 Feet Assistive device: None Gait Pattern/deviations: WFL(Within Functional Limits)   Gait velocity interpretation: >2.62 ft/sec, indicative of community ambulatory General Gait Details: steady ambulation  Stairs Stairs:  (Deferred (pt has ramp to enter home))          Wheelchair Mobility    Modified Rankin (Stroke Patients Only)       Balance Overall balance assessment: Needs assistance;Independent Sitting-balance support: No upper extremity supported;Feet supported Sitting balance-Leahy Scale: Normal Sitting balance - Comments: steady sitting reaching outside BOS   Standing balance support: No upper extremity supported;During functional activity Standing balance-Leahy Scale: Normal Standing balance comment: steady ambulation noted               High Level Balance Comments: No loss of balance with ambulation and head turns R/L/up/down or increasing/decreasing speed             Pertinent Vitals/Pain Pain Assessment: No/denies pain  HR 90 to 110 bpm during session.    Home Living Family/patient expects to be discharged to:: Private residence Living Arrangements: Spouse/significant other Available Help at Discharge: Family Type of Home: House Home Access: [place]: One [place]: None      Prior Function Level of Independence: Independent         Comments: No falls in past 6 months.     Hand Dominance        Extremity/Trunk Assessment   Upper Extremity Assessment Upper Extremity Assessment: Overall WFL for tasks assessed    Lower Extremity Assessment Lower Extremity Assessment: Overall WFL for tasks assessed    Cervical / Trunk Assessment Cervical / Trunk Assessment: Normal  Communication   Communication: No difficulties  Cognition Arousal/Alertness: Awake/alert Behavior During Therapy: WFL for tasks assessed/performed Overall  Cognitive Status: Within Functional Limits for tasks assessed                                        General Comments   Nursing cleared pt for participation in physical therapy.  Pt agreeable to PT session.    Exercises     Assessment/Plan    PT Assessment Patent does not  need any further PT services  PT Problem List Cardiopulmonary status limiting activity       PT Treatment Interventions      PT Goals (Current goals can be found in the Care Plan section)  Acute Rehab PT Goals Patient Stated Goal: to go home PT Goal Formulation: With patient Time For Goal Achievement: 10/01/18 Potential to Achieve Goals: Good    Frequency     Barriers to discharge        Co-evaluation               AM-PAC PT "6 Clicks" Mobility  Outcome Measure Help needed turning from your back to your side while in a flat bed without using bedrails?: None Help needed moving from lying on your back to sitting on the side of a flat bed without using bedrails?: None Help needed moving to and from a bed to a chair (including a wheelchair)?: None Help needed standing up from a chair using your arms (e.g., wheelchair or bedside chair)?: None Help needed to walk in hospital room?: None Help needed climbing 3-5 steps with a railing? : None 6 Click Score: 24    End of Session Equipment Utilized During Treatment: Gait belt Activity Tolerance: Patient tolerated treatment well Patient left: in bed;with call bell/phone within reach;with bed alarm set Nurse Communication: Mobility status;Precautions;Other (comment)(Pt's O2 sats) PT Visit Diagnosis: Other abnormalities of gait and mobility (R26.89)    Time: 0802-2336 PT Time Calculation (min) (ACUTE ONLY): 20 min   Charges:   PT Evaluation $PT Eval Low Complexity: 1 Low         Isaac Cooper, PT 09/17/18, 9:47 AM [phone]

## 2018-09-17 NOTE — Progress Notes (Signed)
Patient discharged home per MD order. Prescriptions given to patient and review. All discharge instructions given and all questions answered.

## 2018-09-17 NOTE — Discharge Instructions (Signed)
Follow-up with primary care physician in 3 days Follow-up with primary oncologist at [facility] reschedule appointment in a week

## 2018-09-18 LAB — CULTURE, BLOOD (ROUTINE X 2)
Culture: NO GROWTH
Culture: NO GROWTH
Special Requests: ADEQUATE

## 2019-04-06 ENCOUNTER — Inpatient Hospital Stay
Admission: EM | Admit: 2019-04-06 | Discharge: 2019-05-11 | DRG: 871 | Disposition: E | Payer: BLUE CROSS/BLUE SHIELD | Attending: Internal Medicine | Admitting: Internal Medicine

## 2019-04-06 ENCOUNTER — Emergency Department: Payer: BLUE CROSS/BLUE SHIELD

## 2019-04-06 ENCOUNTER — Encounter: Payer: Self-pay | Admitting: Emergency Medicine

## 2019-04-06 ENCOUNTER — Other Ambulatory Visit: Payer: Self-pay

## 2019-04-06 DIAGNOSIS — Z7189 Other specified counseling: Secondary | ICD-10-CM | POA: Diagnosis not present

## 2019-04-06 DIAGNOSIS — C7951 Secondary malignant neoplasm of bone: Secondary | ICD-10-CM | POA: Diagnosis present

## 2019-04-06 DIAGNOSIS — F419 Anxiety disorder, unspecified: Secondary | ICD-10-CM | POA: Diagnosis present

## 2019-04-06 DIAGNOSIS — E43 Unspecified severe protein-calorie malnutrition: Secondary | ICD-10-CM | POA: Diagnosis present

## 2019-04-06 DIAGNOSIS — Z85118 Personal history of other malignant neoplasm of bronchus and lung: Secondary | ICD-10-CM | POA: Diagnosis not present

## 2019-04-06 DIAGNOSIS — Z923 Personal history of irradiation: Secondary | ICD-10-CM

## 2019-04-06 DIAGNOSIS — J9622 Acute and chronic respiratory failure with hypercapnia: Secondary | ICD-10-CM | POA: Diagnosis present

## 2019-04-06 DIAGNOSIS — R0902 Hypoxemia: Secondary | ICD-10-CM

## 2019-04-06 DIAGNOSIS — Z7952 Long term (current) use of systemic steroids: Secondary | ICD-10-CM

## 2019-04-06 DIAGNOSIS — Z87891 Personal history of nicotine dependence: Secondary | ICD-10-CM

## 2019-04-06 DIAGNOSIS — J189 Pneumonia, unspecified organism: Secondary | ICD-10-CM | POA: Diagnosis present

## 2019-04-06 DIAGNOSIS — E871 Hypo-osmolality and hyponatremia: Secondary | ICD-10-CM | POA: Diagnosis not present

## 2019-04-06 DIAGNOSIS — C3490 Malignant neoplasm of unspecified part of unspecified bronchus or lung: Secondary | ICD-10-CM | POA: Diagnosis present

## 2019-04-06 DIAGNOSIS — Z515 Encounter for palliative care: Secondary | ICD-10-CM | POA: Diagnosis present

## 2019-04-06 DIAGNOSIS — Z8249 Family history of ischemic heart disease and other diseases of the circulatory system: Secondary | ICD-10-CM

## 2019-04-06 DIAGNOSIS — Z8719 Personal history of other diseases of the digestive system: Secondary | ICD-10-CM

## 2019-04-06 DIAGNOSIS — Z9221 Personal history of antineoplastic chemotherapy: Secondary | ICD-10-CM | POA: Diagnosis not present

## 2019-04-06 DIAGNOSIS — J9621 Acute and chronic respiratory failure with hypoxia: Secondary | ICD-10-CM | POA: Diagnosis present

## 2019-04-06 DIAGNOSIS — Z20828 Contact with and (suspected) exposure to other viral communicable diseases: Secondary | ICD-10-CM | POA: Diagnosis present

## 2019-04-06 DIAGNOSIS — Z681 Body mass index (BMI) 19 or less, adult: Secondary | ICD-10-CM

## 2019-04-06 DIAGNOSIS — Z8261 Family history of arthritis: Secondary | ICD-10-CM

## 2019-04-06 DIAGNOSIS — Z66 Do not resuscitate: Secondary | ICD-10-CM | POA: Diagnosis present

## 2019-04-06 DIAGNOSIS — G9341 Metabolic encephalopathy: Secondary | ICD-10-CM | POA: Diagnosis present

## 2019-04-06 DIAGNOSIS — J440 Chronic obstructive pulmonary disease with acute lower respiratory infection: Secondary | ICD-10-CM | POA: Diagnosis present

## 2019-04-06 DIAGNOSIS — A419 Sepsis, unspecified organism: Principal | ICD-10-CM | POA: Diagnosis present

## 2019-04-06 DIAGNOSIS — E876 Hypokalemia: Secondary | ICD-10-CM | POA: Diagnosis not present

## 2019-04-06 DIAGNOSIS — R0602 Shortness of breath: Secondary | ICD-10-CM | POA: Diagnosis present

## 2019-04-06 DIAGNOSIS — Z8349 Family history of other endocrine, nutritional and metabolic diseases: Secondary | ICD-10-CM

## 2019-04-06 HISTORY — DX: Malignant neoplasm of unspecified part of unspecified bronchus or lung: C34.90

## 2019-04-06 LAB — CBC WITH DIFFERENTIAL/PLATELET
Abs Immature Granulocytes: 0.02 10*3/uL (ref 0.00–0.07)
Basophils Absolute: 0 10*3/uL (ref 0.0–0.1)
Basophils Relative: 0 %
Eosinophils Absolute: 0 10*3/uL (ref 0.0–0.5)
Eosinophils Relative: 0 %
HCT: 38 % — ABNORMAL LOW (ref 39.0–52.0)
Hemoglobin: 11.9 g/dL — ABNORMAL LOW (ref 13.0–17.0)
Immature Granulocytes: 0 %
Lymphocytes Relative: 3 %
Lymphs Abs: 0.3 10*3/uL — ABNORMAL LOW (ref 0.7–4.0)
MCH: 27 pg (ref 26.0–34.0)
MCHC: 31.3 g/dL (ref 30.0–36.0)
MCV: 86.2 fL (ref 80.0–100.0)
Monocytes Absolute: 0 10*3/uL — ABNORMAL LOW (ref 0.1–1.0)
Monocytes Relative: 0 %
Neutro Abs: 9.3 10*3/uL — ABNORMAL HIGH (ref 1.7–7.7)
Neutrophils Relative %: 97 %
Platelets: 235 10*3/uL (ref 150–400)
RBC: 4.41 MIL/uL (ref 4.22–5.81)
RDW: 18 % — ABNORMAL HIGH (ref 11.5–15.5)
WBC: 9.5 10*3/uL (ref 4.0–10.5)
nRBC: 0 % (ref 0.0–0.2)

## 2019-04-06 LAB — COMPREHENSIVE METABOLIC PANEL
ALT: 33 U/L (ref 0–44)
AST: 40 U/L (ref 15–41)
Albumin: 2.9 g/dL — ABNORMAL LOW (ref 3.5–5.0)
Alkaline Phosphatase: 69 U/L (ref 38–126)
Anion gap: 9 (ref 5–15)
BUN: 30 mg/dL — ABNORMAL HIGH (ref 6–20)
CO2: 28 mmol/L (ref 22–32)
Calcium: 8.5 mg/dL — ABNORMAL LOW (ref 8.9–10.3)
Chloride: 99 mmol/L (ref 98–111)
Creatinine, Ser: 0.71 mg/dL (ref 0.61–1.24)
GFR calc Af Amer: >60 mL/min (ref >60)
GFR calc non Af Amer: >60 mL/min (ref >60)
Glucose, Bld: 88 mg/dL (ref 70–99)
Potassium: 4.1 mmol/L (ref 3.5–5.1)
Sodium: 136 mmol/L (ref 135–145)
Total Bilirubin: 0.9 mg/dL (ref 0.3–1.2)
Total Protein: 6.7 g/dL (ref 6.5–8.1)

## 2019-04-06 LAB — PROCALCITONIN: Procalcitonin: 0.99 ng/mL

## 2019-04-06 LAB — LACTIC ACID, PLASMA
Lactic Acid, Venous: 2.2 mmol/L (ref 0.5–1.9)
Lactic Acid, Venous: 2.3 mmol/L (ref 0.5–1.9)

## 2019-04-06 LAB — BRAIN NATRIURETIC PEPTIDE: B Natriuretic Peptide: 76 pg/mL (ref 0.0–100.0)

## 2019-04-06 LAB — SARS CORONAVIRUS 2 BY RT PCR (HOSPITAL ORDER, PERFORMED IN ~~LOC~~ HOSPITAL LAB): SARS Coronavirus 2: NEGATIVE

## 2019-04-06 MED ORDER — BENZONATATE 100 MG PO CAPS: 200.0000 mg | ORAL_CAPSULE | Freq: Three times a day (TID) | ORAL | Status: DC | PRN

## 2019-04-06 MED ORDER — ACETAMINOPHEN 325 MG PO TABS: 650.0000 mg | ORAL_TABLET | Freq: Four times a day (QID) | ORAL | Status: DC | PRN

## 2019-04-06 MED ORDER — SODIUM CHLORIDE 0.9 % IV SOLN
2.0000 g | Freq: Once | INTRAVENOUS | Status: AC
  Administered 2019-04-06: 2 g via INTRAVENOUS
  Filled 2019-04-06: qty 2

## 2019-04-06 MED ORDER — ONDANSETRON 4 MG PO TBDP
4.0000 mg | ORAL_TABLET | Freq: Every day | ORAL | Status: DC
  Administered 2019-04-06 – 2019-04-08 (×3): 4 mg via ORAL
  Filled 2019-04-06 (×4): qty 1

## 2019-04-06 MED ORDER — DULOXETINE HCL 30 MG PO CPEP
30.0000 mg | ORAL_CAPSULE | Freq: Every day | ORAL | Status: DC
  Administered 2019-04-07 – 2019-04-08 (×2): 30 mg via ORAL
  Filled 2019-04-06 (×2): qty 1

## 2019-04-06 MED ORDER — POLYETHYLENE GLYCOL 3350 17 G PO PACK
17.0000 g | PACK | ORAL | Status: DC
  Administered 2019-04-08: 17 g via ORAL
  Filled 2019-04-06: qty 1

## 2019-04-06 MED ORDER — VANCOMYCIN HCL IN DEXTROSE 1-5 GM/200ML-% IV SOLN
1000.0000 mg | Freq: Two times a day (BID) | INTRAVENOUS | Status: DC
  Administered 2019-04-06 – 2019-04-07 (×2): 1000 mg via INTRAVENOUS
  Filled 2019-04-06 (×3): qty 200

## 2019-04-06 MED ORDER — ONDANSETRON HCL 4 MG PO TABS: 4.0000 mg | ORAL_TABLET | Freq: Three times a day (TID) | ORAL | Status: DC | PRN

## 2019-04-06 MED ORDER — ORAL CARE MOUTH RINSE
15.0000 mL | Freq: Two times a day (BID) | OROMUCOSAL | Status: DC
  Administered 2019-04-07 – 2019-04-09 (×3): 15 mL via OROMUCOSAL

## 2019-04-06 MED ORDER — OXYCODONE HCL 5 MG PO TABS
10.0000 mg | ORAL_TABLET | Freq: Once | ORAL | Status: AC
  Administered 2019-04-06: 11:00:00 10 mg via ORAL
  Filled 2019-04-06: qty 2

## 2019-04-06 MED ORDER — SODIUM CHLORIDE 0.9 % IV SOLN
2.0000 g | Freq: Three times a day (TID) | INTRAVENOUS | Status: DC
  Administered 2019-04-06 – 2019-04-08 (×6): 2 g via INTRAVENOUS
  Filled 2019-04-06 (×10): qty 2

## 2019-04-06 MED ORDER — DULOXETINE HCL 30 MG PO CPEP
30.0000 mg | ORAL_CAPSULE | Freq: Every day | ORAL | Status: DC
  Administered 2019-04-06: 30 mg via ORAL
  Filled 2019-04-06: qty 1

## 2019-04-06 MED ORDER — OXYCODONE HCL 5 MG PO TABS
60.0000 mg | ORAL_TABLET | Freq: Three times a day (TID) | ORAL | Status: DC
  Administered 2019-04-06 – 2019-04-08 (×6): 60 mg via ORAL
  Filled 2019-04-06 (×7): qty 12

## 2019-04-06 MED ORDER — PROCHLORPERAZINE MALEATE 10 MG PO TABS
10.0000 mg | ORAL_TABLET | Freq: Every day | ORAL | Status: DC
  Administered 2019-04-06 – 2019-04-07 (×2): 10 mg via ORAL
  Filled 2019-04-06 (×3): qty 1

## 2019-04-06 MED ORDER — TIOTROPIUM BROMIDE MONOHYDRATE 18 MCG IN CAPS
18.0000 ug | ORAL_CAPSULE | Freq: Every day | RESPIRATORY_TRACT | Status: DC
  Administered 2019-04-06 – 2019-04-08 (×3): 18 ug via RESPIRATORY_TRACT
  Filled 2019-04-06: qty 5

## 2019-04-06 MED ORDER — VANCOMYCIN HCL IN DEXTROSE 1-5 GM/200ML-% IV SOLN
1000.0000 mg | Freq: Once | INTRAVENOUS | Status: AC
  Administered 2019-04-06: 1000 mg via INTRAVENOUS
  Filled 2019-04-06: qty 200

## 2019-04-06 MED ORDER — MOMETASONE FURO-FORMOTEROL FUM 200-5 MCG/ACT IN AERO
2.0000 | INHALATION_SPRAY | Freq: Two times a day (BID) | RESPIRATORY_TRACT | Status: DC
  Administered 2019-04-06 – 2019-04-08 (×4): 2 via RESPIRATORY_TRACT
  Filled 2019-04-06: qty 8.8

## 2019-04-06 MED ORDER — DEXAMETHASONE 4 MG PO TABS
2.0000 mg | ORAL_TABLET | Freq: Every day | ORAL | Status: DC
  Administered 2019-04-06 – 2019-04-08 (×3): 2 mg via ORAL
  Filled 2019-04-06 (×4): qty 0.5

## 2019-04-06 MED ORDER — MORPHINE SULFATE ER 100 MG PO TBCR
200.0000 mg | EXTENDED_RELEASE_TABLET | Freq: Three times a day (TID) | ORAL | Status: DC
  Administered 2019-04-06 – 2019-04-08 (×4): 200 mg via ORAL
  Filled 2019-04-06 (×4): qty 2

## 2019-04-06 MED ORDER — SODIUM CHLORIDE 0.9 % IV BOLUS
1000.0000 mL | Freq: Once | INTRAVENOUS | Status: AC
  Administered 2019-04-06: 1000 mL via INTRAVENOUS

## 2019-04-06 MED ORDER — ACETAMINOPHEN 500 MG PO TABS
500.0000 mg | ORAL_TABLET | Freq: Three times a day (TID) | ORAL | Status: DC
  Administered 2019-04-06 – 2019-04-08 (×5): 500 mg via ORAL
  Filled 2019-04-06 (×6): qty 1

## 2019-04-06 MED ORDER — IPRATROPIUM-ALBUTEROL 0.5-2.5 (3) MG/3ML IN SOLN
3.0000 mL | Freq: Four times a day (QID) | RESPIRATORY_TRACT | Status: DC
  Administered 2019-04-06 – 2019-04-08 (×7): 3 mL via RESPIRATORY_TRACT
  Filled 2019-04-06 (×7): qty 3

## 2019-04-06 MED ORDER — ENOXAPARIN SODIUM 40 MG/0.4ML ~~LOC~~ SOLN
40.0000 mg | SUBCUTANEOUS | Status: DC
  Administered 2019-04-06 – 2019-04-07 (×2): 40 mg via SUBCUTANEOUS
  Filled 2019-04-06 (×2): qty 0.4

## 2019-04-06 MED ORDER — GABAPENTIN 300 MG PO CAPS
600.0000 mg | ORAL_CAPSULE | Freq: Three times a day (TID) | ORAL | Status: DC
  Administered 2019-04-06 – 2019-04-08 (×6): 600 mg via ORAL
  Filled 2019-04-06 (×7): qty 2

## 2019-04-06 MED ORDER — SODIUM CHLORIDE 0.9 % IV SOLN
INTRAVENOUS | Status: DC
  Administered 2019-04-06 – 2019-04-07 (×2): via INTRAVENOUS

## 2019-04-06 NOTE — ED Triage Notes (Signed)
Pt seemed altered when woke so wife called EMS. Pt had sats in high 70s on fire arrival, arrived to ED on NRB.  Initially altered on EMS arrival, oriented after oxygenation improved per EMS.  Crackles bilateral per EMS

## 2019-04-06 NOTE — ED Notes (Signed)
Pt taken off of non-rebreather and placed on [place] @ 5 L per Dr. Joan Mayans order. Pt tolerated well at this time, O2 sat 94%. MD notified. Pt given sips of water at this time as well after confirmed with Dr. Joan Mayans.   Dr. Joan Mayans states to put back on non-rebreather if O2 sats drop to the 80's.

## 2019-04-06 NOTE — ED Notes (Signed)
Patient requesting something to drink.  MD notified.

## 2019-04-06 NOTE — Consult Note (Signed)
PHARMACY -  BRIEF ANTIBIOTIC NOTE   Pharmacy has received consult(s) for vancomycin and cefepime from an ED provider.  The patient's profile has been reviewed for ht/wt/allergies/indication/available labs.    One time order(s) placed for vancomycin 1000 mg and cefepime 2 grams  Further antibiotics/pharmacy consults should be ordered by admitting physician if indicated.                       Thank you,  Dallie Piles, PharmD 04/02/2019  9:50 AM

## 2019-04-06 NOTE — ED Notes (Signed)
Date and time results received: 04/02/2019 1214 (use smartphrase ".now" to insert current time)  Test: Lactic Acid Critical Value: 2.3 Name of Provider Notified: Dr. Joan Mayans  Orders Received? Or Actions Taken?: Fluid bolus

## 2019-04-06 NOTE — H&P (Addendum)
[facility] NAME: Isaac Cooper    MR#:  [id number]  DATE OF BIRTH:  1960/09/25  DATE OF ADMISSION:  04/05/2019  PRIMARY CARE PHYSICIAN: Cletis Athens, MD   REQUESTING/REFERRING PHYSICIAN: Derrell Lolling  CHIEF COMPLAINT:   Chief Complaint  Patient presents with  . Respiratory Distress    HISTORY OF PRESENT ILLNESS:  Isaac Cooper  is a 58 y.o. 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  Patient developed worsening shortness of breath and became more confused this morning.  Wife checked the oxygen saturation and noted O2 sat in the 60s at home.  EMS placed nonrebreather with improvement in oxygen saturation to the 90s.  Patient was transferred to the emergency room for further evaluation.  Confusion appears resolved with patient now awake and alert and oriented x3.  Denies any chest pain.  No fevers.  Noted some intermittent cough in the emergency room.  Further evaluation in the emergency room where chest x-ray revealed bilateral multifocal pneumonia.  Patient was tachycardic and tachypneic as such met sepsis criteria as well.  Started on antibiotics and IV fluids.  Patient being admitted to medical service for further evaluation and management. PAST MEDICAL HISTORY:   Past Medical History:  Diagnosis Date  . Colon polyps   . 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 Lung cancer ([place])     PAST SURGICAL HISTORY:   Past Surgical History:  Procedure Laterality Date  . BACK SURGERY    . TESTICLE SURGERY    . TONSILLECTOMY AND ADENOIDECTOMY      SOCIAL HISTORY:   Social History   Tobacco Use  . Smoking status: Former Smoker    Packs/day: 1.25    Years: 45.00     Pack years: 56.25    Types: Cigarettes    Quit date: 05/16/2018    Years since quitting: 0.8  . Smokeless tobacco: Never Used  Substance Use Topics  . Alcohol use: Yes    Comment: About 2 per week     FAMILY HISTORY:   Family History  Problem Relation Age of Onset  . Arthritis Mother   . Hypertension Mother   . Hyperlipidemia Father     DRUG ALLERGIES:  No Known Allergies  REVIEW OF SYSTEMS:   Review of Systems  Constitutional: Negative for chills and fever.  HENT: Negative for hearing loss and tinnitus.   Eyes: Negative for blurred vision.  Respiratory: Positive for shortness of breath. Negative for wheezing.   Cardiovascular: Negative for chest pain and palpitations.  Gastrointestinal: Negative for heartburn and nausea.  Genitourinary: Negative for dysuria and urgency.  Musculoskeletal: Negative for myalgias and neck pain.  Skin: Negative for rash.  Neurological: Negative for dizziness and headaches.  Psychiatric/Behavioral: Negative for depression and hallucinations.    MEDICATIONS AT HOME:   Prior to Admission medications   Medication Sig Start Date End Date Taking? Authorizing Provider  acetaminophen (TYLENOL) 325 MG tablet Take 2 tablets (650 mg total) by mouth every 6 (six) hours as needed for mild pain (or Fever >/= 101). Patient taking differently: Take 650 mg by mouth 3 (three) times daily.  09/17/18  Yes Gouru, Illene Silver, MD  albuterol (PROVENTIL HFA;VENTOLIN HFA) 108 (90 Base)  MCG/ACT inhaler Inhale 2 puffs into the lungs every 6 (six) hours as needed for wheezing or shortness of breath. 09/17/18  Yes Gouru, Illene Silver, MD  benzonatate (TESSALON) 200 MG capsule Take 1 capsule (200 mg total) by mouth 3 (three) times daily as needed for cough. 09/17/18  Yes Gouru, Illene Silver, MD  cefdinir (OMNICEF) 300 MG capsule Take 1 capsule (300 mg total) by mouth 2 (two) times daily. Patient taking differently: Take 300 mg by mouth 3 (three) times a week. Monday, Wednesday and fridays  09/17/18  Yes Gouru, Aruna, MD  dexamethasone (DECADRON) 4 MG tablet Take 2 mg by mouth daily. 03/22/19  Yes [provider]  DULoxetine (CYMBALTA) 30 MG capsule Take 30 mg by mouth daily. 03/03/19  Yes [provider]  gabapentin (NEURONTIN) 300 MG capsule Take 600 mg by mouth 3 (three) times daily.  03/22/19  Yes [provider]  morphine (KADIAN) 200 MG 24 hr capsule Take 200 mg by mouth 3 (three) times daily.    Yes [provider]  ondansetron (ZOFRAN) 4 MG tablet Take 4 mg by mouth every 8 (eight) hours as needed for nausea or vomiting.   Yes [provider]  ondansetron (ZOFRAN-ODT) 4 MG disintegrating tablet Take 4 mg by mouth every 8 (eight) hours as needed for nausea or vomiting.   Yes [provider]  oxycodone (ROXICODONE) 30 MG immediate release tablet Take 60 mg by mouth 3 (three) times daily. 04/03/19  Yes [provider]  polyethylene glycol (MIRALAX / GLYCOLAX) 17 g packet Take 17 g by mouth every other day.   Yes [provider]  prochlorperazine (COMPAZINE) 10 MG tablet Take 10 mg by mouth every 6 (six) hours as needed for nausea. 06/25/18 06/25/19 Yes [provider]  SPIRIVA HANDIHALER 18 MCG inhalation capsule Place 18 mcg into inhaler and inhale daily. 01/21/19  Yes [provider]  SYMBICORT 160-4.5 MCG/ACT inhaler Inhale 1 puff into the lungs 2 (two) times daily. 03/11/19  Yes [provider]      VITAL SIGNS:  Blood pressure 97/69, pulse (!) 109, temperature 98 F (36.7 C), temperature source Oral, resp. rate 20, SpO2 96 %.  PHYSICAL EXAMINATION:  Physical Exam  GENERAL:  58 y.o.-year-old patient lying in the bed with no acute distress.  EYES: Pupils equal, round, reactive to light and accommodation. No scleral icterus. Extraocular muscles intact.  HEENT: Head atraumatic, normocephalic. Oropharynx and nasopharynx clear.  NECK:  Supple, no jugular venous distention. No thyroid  enlargement, no tenderness.  LUNGS: Rhonchi bilaterally.  No wheezing.  Mild use of accessory muscles of respiration.  CARDIOVASCULAR: S1, S2 normal. No murmurs, rubs, or gallops.  ABDOMEN: Soft, nontender, nondistended. Bowel sounds present. No organomegaly or mass.  EXTREMITIES: No pedal edema, cyanosis, or clubbing.  NEUROLOGIC: Cranial nerves II through XII are intact. Muscle strength 5/5 in all extremities. Sensation intact. Gait not checked.  PSYCHIATRIC: The patient is alert and oriented x 3.  SKIN: No obvious rash, lesion, or ulcer.   LABORATORY PANEL:   CBC Recent Labs  Lab 04/03/2019 0936  WBC 9.5  HGB 11.9*  HCT 38.0*  PLT 235   ------------------------------------------------------------------------------------------------------------------  Chemistries  Recent Labs  Lab 03/12/2019 0936  NA 136  K 4.1  CL 99  CO2 28  GLUCOSE 88  BUN 30*  CREATININE 0.71  CALCIUM 8.5*  AST 40  ALT 33  ALKPHOS 69  BILITOT 0.9   ------------------------------------------------------------------------------------------------------------------  Cardiac Enzymes No results for input(s): TROPONINI  in the last 168 hours. ------------------------------------------------------------------------------------------------------------------  RADIOLOGY:  Dg Chest Port 1 View  Result Date: 03/11/2019 CLINICAL DATA:  Hypoxia. EXAM: PORTABLE CHEST 1 VIEW COMPARISON:  Radiograph September 13, 2018. FINDINGS: The heart size and mediastinal contours are within normal limits. No pneumothorax or pleural effusion is noted. Interval placement of right internal jugular Port-A-Cath with tip in expected position of right atrium. New left midlung and lower lobe airspace opacity is noted, with increased right midlung opacity concerning for pneumonia. The visualized skeletal structures are unremarkable. IMPRESSION: Bilateral lung opacities are noted, left greater than right, concerning for multifocal pneumonia.  Electronically Signed   By: Marijo Conception M.D.   On: 03/15/2019 09:59      IMPRESSION AND PLAN:  Patient is a 58 year old male with history of squamous cell lung cancer of the lungs with bone mets currently on chemotherapy being admitted for further evaluation and management of sepsis secondary to multifocal pneumonia  1.  Sepsis secondary to bilateral multifocal pneumonia Placed on empiric antibiotics with IV vancomycin and cefepime with pharmacy to assist with renal dosing.  IV fluid hydration.  Follow-up on cultures.  Monitor clinically  2.  Multifocal pneumonia; bilateral Placed on broad-spectrum IV antibiotics with IV vancomycin and cefepime. Follow-up on cultures. COVID test negative.  3.  Acute on chronic hypoxic respiratory failure Secondary to bilateral multifocal pneumonia Patient on 2 L of home oxygen therapy.  Was initially requiring nonrebreather today. Now weaned down to high flow oxygen.  Wean down oxygen requirement as tolerated  4.  History of squamous cell lung cancer with bone mets Previously treated with immunotherapy, chemotherapy and radiation therapy. Recently restarted on chemotherapy last week.  Follows up with [facility]. If patient's clinical condition continues to worsen despite ongoing management to consider palliative care consult  5.  Acute metabolic encephalopathy Appears resolved at this time.  Patient now awake and alert and oriented x3 Multifactorial secondary to hypoxia and infectious process above.   DVT prophylaxis; Lovenox   All the records are reviewed and case discussed with ED provider. Management plans discussed with the patient, family and they are in agreement. Updated wife present at bedside who agrees with the patient is decision to be DNR/DNI going forward.  CODE STATUS: DNR/DNI  TOTAL TIME TAKING CARE OF THIS PATIENT: 61 minutes.    Rose Hippler M.D on 03/27/2019 at 12:44 PM  Between 7am to 6pm - Pager - [phone]  After 6pm  go to [email or web address] - Proofreader  [facility]  Office  [phone]  CC: Primary care physician; Cletis Athens, MD   Note: This dictation was prepared with Dragon dictation along with smaller phrase technology. Any transcriptional errors that result from this process are unintentional.

## 2019-04-06 NOTE — ED Notes (Signed)
Date and time results received: 03/28/2019 1017 (use smartphrase ".now" to insert current time)  Test: Lactic Acid Critical Value: 2.2  Name of Provider Notified: Dr. Joan Mayans  Orders Received? Or Actions Taken?:

## 2019-04-06 NOTE — ED Notes (Signed)
Gave report to charge RN who could not take the patient at this time due to concerns about proper placement.

## 2019-04-06 NOTE — ED Notes (Signed)
Date and time results received: 03/30/2019 12:14 PM  (use smartphrase ".now" to insert current time)  Lactic Acid Critical Value: 2.3  Name of Provider Notified: Dr. Joan Mayans  Orders Received? Or Actions Taken?: Acknowledged

## 2019-04-06 NOTE — ED Provider Notes (Signed)
[facility] Emergency Department Provider Note  ____________________________________________   First MD Initiated Contact with Patient 03/30/2019 [phone]     (approximate)  I have reviewed the triage vital signs and the nursing notes.  History  Chief Complaint Respiratory Distress    HPI Isaac Cooper is a 58 y.o. male with a history of squamous cell carcinoma of the lung (followed by [facility]), on chemotherapy, who presents the emergency department for hypoxia and altered mental status.  Per report, when the patient woke up this morning, the wife noticed that he was altered.  She checked his pulse ox and noted it to be in the high 60s.  He typically only wears nasal cannula at night.  On fire department arrival his oxygen saturation was in the 70s, EMS placed him on a nonrebreather, with improvement into the 90s.  EMS noted him to be hypothermic, soft BP. En route, his oxygenation improved on NRB as well as his mental status.  On arrival, he is alert and oriented x 3.  The patient denies any shortness of breath, his only complaint is his chronic cancer pain.   Past Medical Hx Past Medical History:  Diagnosis Date  . Colon polyps   . 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 Lung cancer [facility])     Problem List Patient Active Problem List   Diagnosis Date Noted  . Lung cancer ([place]) 09/13/2018  . HCAP (healthcare-associated pneumonia) 09/13/2018  . Sepsis ([place]) 09/13/2018  . Umbilical hernia without obstruction and without gangrene 11/30/2016  . Erectile dysfunction 11/30/2016    Past Surgical Hx Past Surgical History:  Procedure Laterality Date  . BACK SURGERY    . TESTICLE SURGERY    . TONSILLECTOMY AND ADENOIDECTOMY      Medications Prior to Admission medications   Medication Sig Start Date End Date Taking? Authorizing Provider  acetaminophen (TYLENOL) 325 MG tablet Take 2 tablets (650 mg total) by mouth every 6 (six) hours as needed for mild  pain (or Fever >/= 101). 09/17/18   Gouru, Illene Silver, MD  albuterol (PROVENTIL HFA;VENTOLIN HFA) 108 (90 Base) MCG/ACT inhaler Inhale 2 puffs into the lungs every 6 (six) hours as needed for wheezing or shortness of breath. 09/17/18   Gouru, Illene Silver, MD  benzonatate (TESSALON) 200 MG capsule Take 1 capsule (200 mg total) by mouth 3 (three) times daily as needed for cough. 09/17/18   Nicholes Mango, MD  cefdinir (OMNICEF) 300 MG capsule Take 1 capsule (300 mg total) by mouth 2 (two) times daily. 09/17/18   Gouru, Illene Silver, MD  fluconazole (DIFLUCAN) 100 MG tablet Take 1 tablet (100 mg total) by mouth daily. 09/17/18   Gouru, Illene Silver, MD  guaiFENesin-dextromethorphan (ROBITUSSIN DM) 100-10 MG/5ML syrup Take 5 mLs by mouth every 4 (four) hours as needed for cough. 09/17/18   Nicholes Mango, MD  magic mouthwash w/lidocaine SOLN Take 5 mLs by mouth 4 (four) times daily.    [provider]  morphine (MS CONTIN) 60 MG 12 hr tablet Take 60 mg by mouth every 12 (twelve) hours.    [provider]  ondansetron (ZOFRAN) 4 MG tablet Take 4 mg by mouth every 8 (eight) hours as needed for nausea or vomiting.    [provider]  ondansetron (ZOFRAN-ODT) 4 MG disintegrating tablet Take 4 mg by mouth every 8 (eight) hours as needed for nausea or vomiting.    [provider]  Oxycodone HCl 10 MG TABS Take 20 mg by mouth every 6 (  six) hours as needed (pain).     [provider]  predniSONE (STERAPRED UNI-PAK 21 TAB) 10 MG (21) TBPK tablet Take 1 tablet (10 mg total) by mouth daily. Take 6 tablets by mouth for 1 day followed by  5 tablets by mouth for 1 day followed by  4 tablets by mouth for 1 day followed by  3 tablets by mouth for 1 day followed by  2 tablets by mouth for 1 day followed by  1 tablet by mouth for a day and stop 09/17/18   Nicholes Mango, MD  prochlorperazine (COMPAZINE) 10 MG tablet Take 10 mg by mouth every 6 (six) hours as needed for nausea. 06/25/18 06/25/19  [provider]    Allergies Patient has no known allergies.  Family Hx Family History  Problem Relation Age of Onset  . Arthritis Mother   . Hypertension Mother   . Hyperlipidemia Father     Social Hx Social History   Tobacco Use  . Smoking status: Former Smoker    Packs/day: 1.25    Years: 45.00    Pack years: 56.25    Types: Cigarettes    Quit date: 05/16/2018    Years since quitting: 0.8  . Smokeless tobacco: Never Used  Substance Use Topics  . Alcohol use: Yes    Comment: About 2 per week   . Drug use: No     Review of Systems  Constitutional: Negative for fever, chills. Eyes: Negative for visual changes. ENT: Negative for sore throat. Cardiovascular: Negative for chest pain. Respiratory: + hypoxia Gastrointestinal: Negative for nausea, vomiting.  Genitourinary: Negative for dysuria. Musculoskeletal: Negative for leg swelling. Skin: Negative for rash. Neurological: + AMS   Physical Exam  Vital Signs: ED Triage Vitals  Enc Vitals Group     BP 04/07/2019 0927 125/79     Pulse Rate 03/14/2019 0927 (!) 116     Resp 03/19/2019 0927 (!) 26     Temp 03/14/2019 0927 98 F (36.7 C)     Temp Source 03/16/2019 0927 Oral     SpO2 03/27/2019 0921 (!) 77 %     Weight --      Height --      Head Circumference --      Peak Flow --      Pain Score 04/09/2019 0922 0     Pain Loc --      Pain Edu? --      Excl. in [place]? --     Constitutional: Alert and oriented.  Head: Normocephalic. Atraumatic. Eyes: Conjunctivae clear. Sclera anicteric. Nose: No congestion. No rhinorrhea. Mouth/Throat: Mucous membranes are dry.  Neck: No stridor.   Cardiovascular: Tachycardic, regular rhythm. Extremities well perfused. Respiratory: Arrives on NRB. Bilateral crackles and rhonchi, as well as faint end expiratory wheezing.  Gastrointestinal: Soft. Non-tender. Non-distended.  Musculoskeletal: No lower extremity edema. Clubbing of toes and fingers.  Neurologic:  Normal speech and language.  No gross focal neurologic deficits are appreciated.  Skin: Skin is warm, dry and intact. No rash noted. Psychiatric: Mood and affect are appropriate for situation.  EKG  Personally reviewed.   Rate: 119, tachycardic Rhythm: sinus Axis: normal Intervals: WNL Wandering baseline No STEMI    Radiology  XR: IMPRESSION: Bilateral lung opacities are noted, left greater than right, concerning for multifocal pneumonia.     Procedures  Procedure(s) performed (including critical care):  .Critical Care Performed by: Lilia Pro., MD Authorized by: Lilia Pro., MD   Critical  care provider statement:    Critical care time (minutes):  45   Critical care was necessary to treat or prevent imminent or life-threatening deterioration of the following conditions:  Sepsis and respiratory failure   Critical care was time spent personally by me on the following activities:  Discussions with consultants, evaluation of patient's response to treatment, examination of patient, ordering and performing treatments and interventions, ordering and review of laboratory studies, ordering and review of radiographic studies, pulse oximetry, re-evaluation of patient's condition, obtaining history from patient or surrogate and review of old charts     Initial Impression / Assessment and Plan / ED Course  58 y.o. male with a history of squamous cell carcinoma of the lung (followed by [facility]), on chemotherapy, who presents the emergency department for hypoxia and altered mental status, as above.  Ddx: PNA, COVID  Plan: labs, including cultures, lactate, VBG, procalcitonin.  Given his tachycardia and initial hypothermia with EMS, concern for sepsis, especially in a chemotherapy patient.  Empiric antibiotics, fluids.  Received 800 cc fluids with NS prior to arrival.  Work up reveals multifocal PNA. COVID negative. Attempted transition from NRB to [place], but saturations dropped to high 80s/low 90s. Will put  on HFNC. Discussed with hospitalist for admission.   Final Clinical Impression(s) / ED Diagnosis  Final diagnoses:  SOB (shortness of breath)  Hypoxia       Note:  This document was prepared using Dragon voice recognition software and may include unintentional dictation errors.   Lilia Pro., MD 03/23/2019 1726

## 2019-04-06 NOTE — Consult Note (Signed)
Pharmacy Antibiotic Note  Isaac Cooper is a 58 y.o. male admitted on 04/04/2019 with sepsis likely due to mult-focal PNA.  Pharmacy has been consulted for cefepime and vancomycin dosing. His renal function is at what appears to be his baseline  Plan: 1) start cefepime 2 grams IV every 8 hours  2) start vancomycin 1000 mg IV Q 12 hrs Goal AUC 400-550. Expected AUC: 554 SCr used: 0.80 (rounded up) Css: 37.6/14.0 mcg/mL T1/2: 6.9 h BMP in am  Weight: 124 lb (56.2 kg)  Temp (24hrs), Avg:98 F (36.7 C), Min:98 F (36.7 C), Max:98 F (36.7 C)  Recent Labs  Lab 04/05/2019 0936 03/23/2019 1138  WBC 9.5  --   CREATININE 0.71  --   LATICACIDVEN 2.2* 2.3*    CrCl cannot be calculated (Unknown ideal weight.).    No Known Allergies  Antimicrobials this admission: vancomycin 9/27 >>  cefepime 9/27 >>   Microbiology results: 9/27 BCx: pending 9/27 SARS CoV-2: negative   Thank you for allowing pharmacy to be a part of this patient's care.  Dallie Piles, PharmD 03/16/2019 1:32 PM

## 2019-04-06 NOTE — ED Notes (Signed)
Patient repositioned on the bed and given a pillow. Patient is alert, calm. Wife at bedside. Pulse ox improved with humidified O2 at 10L via [place] placed by RT.

## 2019-04-06 NOTE — Progress Notes (Signed)
Patient admitted to floor from ED via stretcher.  Patient on high flow oxygen. Wife at bedside. Oriented to floor. Call bell in reach. Bed alarm on for safety.

## 2019-04-07 LAB — CBC
HCT: 27.6 % — ABNORMAL LOW (ref 39.0–52.0)
Hemoglobin: 8.7 g/dL — ABNORMAL LOW (ref 13.0–17.0)
MCH: 26.7 pg (ref 26.0–34.0)
MCHC: 31.5 g/dL (ref 30.0–36.0)
MCV: 84.7 fL (ref 80.0–100.0)
Platelets: 157 10*3/uL (ref 150–400)
RBC: 3.26 MIL/uL — ABNORMAL LOW (ref 4.22–5.81)
RDW: 17.7 % — ABNORMAL HIGH (ref 11.5–15.5)
WBC: 3.6 10*3/uL — ABNORMAL LOW (ref 4.0–10.5)
nRBC: 0 % (ref 0.0–0.2)

## 2019-04-07 LAB — BASIC METABOLIC PANEL
Anion gap: 5 (ref 5–15)
BUN: 12 mg/dL (ref 6–20)
CO2: 29 mmol/L (ref 22–32)
Calcium: 8.1 mg/dL — ABNORMAL LOW (ref 8.9–10.3)
Chloride: 100 mmol/L (ref 98–111)
Creatinine, Ser: 0.4 mg/dL — ABNORMAL LOW (ref 0.61–1.24)
GFR calc Af Amer: >60 mL/min (ref >60)
GFR calc non Af Amer: >60 mL/min (ref >60)
Glucose, Bld: 105 mg/dL — ABNORMAL HIGH (ref 70–99)
Potassium: 3.4 mmol/L — ABNORMAL LOW (ref 3.5–5.1)
Sodium: 134 mmol/L — ABNORMAL LOW (ref 135–145)

## 2019-04-07 LAB — PROTIME-INR
INR: 1.1 (ref 0.8–1.2)
Prothrombin Time: 14.4 seconds (ref 11.4–15.2)

## 2019-04-07 LAB — MRSA PCR SCREENING: MRSA by PCR: NEGATIVE

## 2019-04-07 LAB — PROCALCITONIN: Procalcitonin: 4.98 ng/mL

## 2019-04-07 LAB — MAGNESIUM: Magnesium: 1.9 mg/dL (ref 1.7–2.4)

## 2019-04-07 LAB — CORTISOL-AM, BLOOD: Cortisol - AM: 8.7 ug/dL (ref 6.7–22.6)

## 2019-04-07 MED ORDER — POTASSIUM CHLORIDE 10 MEQ/100ML IV SOLN
10.0000 meq | INTRAVENOUS | Status: AC
  Administered 2019-04-07 (×4): 10 meq via INTRAVENOUS
  Filled 2019-04-07 (×4): qty 100

## 2019-04-07 NOTE — Progress Notes (Signed)
[facility] [place] NAME: Isaac Cooper    MR#:  [id number]  DATE OF BIRTH:  [birth year]  SUBJECTIVE:   wife at bedside Patient underwent first chemotherapy last Thursday.  Symptoms started on Saturday.  Patient started to decline in mental status and respiratory status.  Patient has had chemo and radiation in the past however did not do well with that as well.   REVIEW OF SYSTEMS:    Review of Systems  Constitutional:++  fever, chills weight loss HENT: Negative for ear pain, nosebleeds, congestion, facial swelling, rhinorrhea, neck pain, neck stiffness and ear discharge.   Respiratory:++ for cough, shortness of breath, wheezing  Cardiovascular: Negative for chest pain, palpitations and leg swelling.  Gastrointestinal: Negative for heartburn, abdominal pain, vomiting, diarrhea or consitpation Genitourinary: Negative for dysuria, urgency, frequency, hematuria Musculoskeletal: Negative for back pain or joint pain Neurological: Negative for dizziness, seizures, syncope, focal weakness,  numbness and headaches.  Hematological: ++Does  bruise/bleed easily.  Psychiatric/Behavioral: Negative for hallucinations, confusion, dysphoric mood    Tolerating Diet: yes      DRUG ALLERGIES:  No Known Allergies  VITALS:  Blood pressure 98/64, pulse 97, temperature 98.3 F (36.8 C), temperature source Oral, resp. rate 20, height 5\' 7"  (1.702 m), weight 56 kg, SpO2 96 %.  PHYSICAL EXAMINATION:  Constitutional: Appears well-developed and well-nourished. No distress. HENT: Normocephalic. Marland Kitchen Oropharynx is clear and moist.  Eyes: Conjunctivae and EOM are normal. PERRLA, no scleral icterus.  Neck: Normal ROM. Neck supple. No JVD. No tracheal deviation. CVS: RRR, S1/S2 +, no murmurs, no gallops, no carotid bruit.  Pulmonary: b/l rhonchii  Abdominal: Soft. BS +,  no distension, tenderness, rebound or guarding.  Musculoskeletal: Normal range of motion. No  edema and no tenderness.  Neuro: lethargic but responds softly and follows commands when asked to do so. . No focal deficits. Skin: Skin is warm and dry. No rash noted.some brusing on arms noted Psychiatric: lethargic      LABORATORY PANEL:   CBC Recent Labs  Lab 04/07/19 0531  WBC 3.6*  HGB 8.7*  HCT 27.6*  PLT 157   ------------------------------------------------------------------------------------------------------------------  Chemistries  Recent Labs  Lab 03/31/2019 0936 04/07/19 0531  NA 136 134*  K 4.1 3.4*  CL 99 100  CO2 28 29  GLUCOSE 88 105*  BUN 30* 12  CREATININE 0.71 0.40*  CALCIUM 8.5* 8.1*  MG  --  1.9  AST 40  --   ALT 33  --   ALKPHOS 69  --   BILITOT 0.9  --    ------------------------------------------------------------------------------------------------------------------  Cardiac Enzymes No results for input(s): TROPONINI in the last 168 hours. ------------------------------------------------------------------------------------------------------------------  RADIOLOGY:  Dg Chest Port 1 View  Result Date: 04/02/2019 CLINICAL DATA:  Hypoxia. EXAM: PORTABLE CHEST 1 VIEW COMPARISON:  Radiograph September 13, 2018. FINDINGS: The heart size and mediastinal contours are within normal limits. No pneumothorax or pleural effusion is noted. Interval placement of right internal jugular Port-A-Cath with tip in expected position of right atrium. New left midlung and lower lobe airspace opacity is noted, with increased right midlung opacity concerning for pneumonia. The visualized skeletal structures are unremarkable. IMPRESSION: Bilateral lung opacities are noted, left greater than right, concerning for multifocal pneumonia. Electronically Signed   By: Marijo Conception M.D.   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  1.  Sepsis with acute on chronic toxic respiratory failure: And tachycardia.  Sepsis is due to to bilateral pneumonia.  Wean oxygen to 2 L nasal cannula as tolerated. Follow-up on cultures. Discontinue IV fluids for now as patient has rhonchorous breath sounds   2.  Bilateral multifocal pneumonia: Continue cefepime.  MRSA screening is negative and therefore vancomycin has been discontinued.  3.  Squamous cell lung cancer with bone mets followed at [facility]: Discussed with patient and family.  They are agreeable to palliative care consult.  4.  Acute metabolic encephalopathy in the setting of above issues with have improved.  5.  Mild hypokalemia: Replete and recheck in a.m.       Management plans discussed with the patient and wife and they are in agreement.  CODE STATUS: DNR  TOTAL TIME TAKING CARE OF THIS PATIENT: 30 minutes.     POSSIBLE D/C 2-4 days, DEPENDING ON CLINICAL CONDITION.   Bettey Costa M.D on 04/07/2019 at 10:56 AM  Between 7am to 6pm - Pager - [phone] After 6pm go to [email or web address] - password [email or web address] [facility]  Office  [phone]  CC: Primary care physician; Cletis Athens, MD  Note: This dictation was prepared with Dragon dictation along with smaller phrase technology. Any transcriptional errors that result from this process are unintentional.

## 2019-04-07 NOTE — Progress Notes (Signed)
Family Meeting Note  Advance Directive:yes  Today a meeting took place with the Patient.spouse   The following clinical team members were present during this meeting:MD  The following were discussed:Patient's diagnosis: Sepsis with acute hypoxic respiratory failure on chronic hypoxic respiratory failure due to bilateral pneumonia with metastatic lung cancer, Patient's progosis: < 12 months and Goals for treatment: DNR  Additional follow-up to be provided: Palliative care consultation  Time spent during discussion:18 minutes  Bettey Costa, MD

## 2019-04-08 ENCOUNTER — Inpatient Hospital Stay: Payer: BLUE CROSS/BLUE SHIELD

## 2019-04-08 DIAGNOSIS — R0902 Hypoxemia: Secondary | ICD-10-CM

## 2019-04-08 DIAGNOSIS — Z7189 Other specified counseling: Secondary | ICD-10-CM

## 2019-04-08 DIAGNOSIS — Z66 Do not resuscitate: Secondary | ICD-10-CM

## 2019-04-08 DIAGNOSIS — Z515 Encounter for palliative care: Secondary | ICD-10-CM

## 2019-04-08 DIAGNOSIS — R0602 Shortness of breath: Secondary | ICD-10-CM

## 2019-04-08 LAB — RESPIRATORY PANEL BY PCR

## 2019-04-08 LAB — BASIC METABOLIC PANEL
Anion gap: 10 (ref 5–15)
BUN: 12 mg/dL (ref 6–20)
CO2: 27 mmol/L (ref 22–32)
Calcium: 8.6 mg/dL — ABNORMAL LOW (ref 8.9–10.3)
Chloride: 91 mmol/L — ABNORMAL LOW (ref 98–111)
Creatinine, Ser: 0.47 mg/dL — ABNORMAL LOW (ref 0.61–1.24)
GFR calc Af Amer: >60 mL/min (ref >60)
GFR calc non Af Amer: >60 mL/min (ref >60)
Glucose, Bld: 99 mg/dL (ref 70–99)
Potassium: 4.3 mmol/L (ref 3.5–5.1)
Sodium: 128 mmol/L — ABNORMAL LOW (ref 135–145)

## 2019-04-08 MED ORDER — LORAZEPAM 2 MG/ML IJ SOLN
1.0000 mg | INTRAMUSCULAR | Status: DC | PRN
  Administered 2019-04-08 – 2019-04-09 (×2): 1 mg via INTRAVENOUS
  Filled 2019-04-08 (×2): qty 1

## 2019-04-08 MED ORDER — MORPHINE 100MG IN NS 100ML (1MG/ML) PREMIX INFUSION
4.0000 mg/h | INTRAVENOUS | Status: DC
  Administered 2019-04-09: 2 mg/h via INTRAVENOUS
  Administered 2019-04-09: 14:00:00 10 mg/h via INTRAVENOUS
  Administered 2019-04-09 – 2019-04-10 (×2): 15 mg/h via INTRAVENOUS
  Filled 2019-04-08 (×4): qty 100

## 2019-04-08 MED ORDER — IPRATROPIUM-ALBUTEROL 0.5-2.5 (3) MG/3ML IN SOLN
RESPIRATORY_TRACT | Status: AC
  Filled 2019-04-08: qty 3

## 2019-04-08 MED ORDER — ONDANSETRON HCL 4 MG/2ML IJ SOLN: 4.0000 mg | Freq: Four times a day (QID) | INTRAMUSCULAR | Status: DC | PRN

## 2019-04-08 MED ORDER — SCOPOLAMINE 1 MG/3DAYS TD PT72
1.0000 | MEDICATED_PATCH | TRANSDERMAL | Status: DC
  Administered 2019-04-08: 1.5 mg via TRANSDERMAL
  Filled 2019-04-08: qty 1

## 2019-04-08 MED ORDER — SODIUM CHLORIDE 0.9 % IV SOLN
INTRAVENOUS | Status: DC
  Administered 2019-04-08: 13:00:00 via INTRAVENOUS

## 2019-04-08 MED ORDER — NYSTATIN 100000 UNIT/ML MT SUSP
5.0000 mL | Freq: Four times a day (QID) | OROMUCOSAL | Status: DC
  Administered 2019-04-08: 10:00:00 500000 [IU] via ORAL
  Filled 2019-04-08 (×2): qty 5

## 2019-04-08 MED ORDER — GLYCOPYRROLATE 0.2 MG/ML IJ SOLN
0.3000 mg | INTRAMUSCULAR | Status: DC
  Administered 2019-04-08 – 2019-04-09 (×4): 0.3 mg via INTRAVENOUS
  Filled 2019-04-08 (×7): qty 1.5

## 2019-04-08 MED ORDER — MORPHINE SULFATE (PF) 2 MG/ML IV SOLN
2.0000 mg | INTRAVENOUS | Status: DC | PRN
  Administered 2019-04-08 (×3): 2 mg via INTRAVENOUS
  Filled 2019-04-08 (×3): qty 1

## 2019-04-08 MED ORDER — ACETAMINOPHEN 650 MG RE SUPP: 650.0000 mg | Freq: Four times a day (QID) | RECTAL | Status: DC | PRN

## 2019-04-08 MED ORDER — BIOTENE DRY MOUTH MT LIQD: 15.0000 mL | OROMUCOSAL | Status: DC | PRN

## 2019-04-08 MED ORDER — MORPHINE SULFATE (PF) 2 MG/ML IV SOLN
2.0000 mg | INTRAVENOUS | Status: DC | PRN
  Administered 2019-04-08 (×4): 2 mg via INTRAVENOUS
  Filled 2019-04-08 (×4): qty 1

## 2019-04-08 MED ORDER — MORPHINE BOLUS VIA INFUSION
2.0000 mg | INTRAVENOUS | Status: DC | PRN
  Administered 2019-04-09: 3 mg via INTRAVENOUS
  Administered 2019-04-09 (×4): 4 mg via INTRAVENOUS
  Administered 2019-04-09: 3 mg via INTRAVENOUS
  Administered 2019-04-09: 4 mg via INTRAVENOUS
  Administered 2019-04-09: 2 mg via INTRAVENOUS
  Administered 2019-04-09 (×4): 4 mg via INTRAVENOUS
  Administered 2019-04-09: 3 mg via INTRAVENOUS
  Administered 2019-04-09: 4 mg via INTRAVENOUS
  Administered 2019-04-09: 2 mg via INTRAVENOUS
  Administered 2019-04-09 – 2019-04-10 (×5): 4 mg via INTRAVENOUS
  Filled 2019-04-08: qty 4

## 2019-04-08 MED ORDER — POLYVINYL ALCOHOL 1.4 % OP SOLN
1.0000 [drp] | Freq: Four times a day (QID) | OPHTHALMIC | Status: DC | PRN
  Filled 2019-04-08: qty 15

## 2019-04-08 NOTE — Progress Notes (Signed)
Physician asked chaplain to offer support to family while transitioning to comfort care. Chaplain offered support to wife, then two sons, parents, siblings and other family who arrived to engage with Coralyn Mark while he was alert. Family is grieving many loses especially the rapid decline of Dominic as well as other family members with health declines including Hommer's father. Chaplain modeled end of life grieving and offered prayer with family which appeared to bring comfort to family.     04/08/19 2200  Clinical Encounter Type  Visited With Patient;Family  Visit Type Patient actively dying  Referral From Physician  Spiritual Encounters  Spiritual Needs Emotional;Prayer

## 2019-04-08 NOTE — Progress Notes (Signed)
CRITICAL CARE NOTE  CC  Severe resp distress  SUBJECTIVE I was called to bedside to evaluate patient Patient with severe resp distress Patient is DNR/DNI  BiPAP will not benefit the patient as he is dying  Patient with increased WOB and suffering and suffocating He is drowning in his own fluid  Patient with severe pneumonia with end stage lung cancer and COPD  Family At bedside, clinical status relayed to family  Updated and notified of patients medical condition-   very low chance of meaningful recovery.  Patient is in dying  Process.  Family understands the situation. I have updated sister over the phone  The wife has consented and agreed to comfort care measures  All family should be allowed to come to bedside and visit.        BP 133/75 (BP Location: Left Arm)   Pulse (!) 112   Temp 98.5 F (36.9 C) (Oral)   Resp 20   Ht 5\' 7"  (1.702 m)   Wt 54.3 kg   SpO2 95%   BMI 18.73 kg/m    I/O last 3 completed shifts: In: 2128.4 [P.O.:360; I.V.:1057.7; IV Piggyback:710.7] Out: 2877 [Urine:2876; Stool:1] Total I/O In: 240 [P.O.:240] Out: 700 [Urine:700]  SpO2: 95 % O2 Flow Rate (L/min): 11 L/min   SIGNIFICANT EVENTS   REVIEW OF SYSTEMS  PATIENT IS UNABLE TO PROVIDE COMPLETE REVIEW OF SYSTEMS DUE TO SEVERE CRITICAL ILLNESS   PHYSICAL EXAMINATION:  GENERAL:critically ill appearing, +resp distress HEAD: Normocephalic, atraumatic.  EYES: Pupils equal, round, reactive to light.  No scleral icterus.  MOUTH: Moist mucosal membrane. NECK: Supple.  PULMONARY: +rhonchi, +wheezing CARDIOVASCULAR: S1 and S2. Regular rate and rhythm. No murmurs, rubs, or gallops.  GASTROINTESTINAL: Soft, nontender, -distended. No masses. Positive bowel sounds. No hepatosplenomegaly.  MUSCULOSKELETAL: No swelling, clubbing, or edema.  NEUROLOGIC: obtunded, GCS<8 SKIN:intact,warm,dry  MEDICATIONS: I have reviewed all medications and confirmed regimen as  documented   CULTURE RESULTS   Recent Results (from the past 240 hour(s))  Culture, blood (routine x 2)     Status: None (Preliminary result)   Collection Time: 03/31/2019  9:36 AM   Specimen: BLOOD  Result Value Ref Range Status   Specimen Description BLOOD R HAND  Final   Special Requests   Final    BOTTLES DRAWN AEROBIC AND ANAEROBIC Blood Culture results may not be optimal due to an excessive volume of blood received in culture bottles   Culture   Final    NO GROWTH 2 DAYS Performed at [facility], [address] [place], [place] [address]    Report Status PENDING  Incomplete  Culture, blood (routine x 2)     Status: None (Preliminary result)   Collection Time: 03/17/2019  9:38 AM   Specimen: BLOOD  Result Value Ref Range Status   Specimen Description BLOOD L HAND  Final   Special Requests   Final    BOTTLES DRAWN AEROBIC AND ANAEROBIC Blood Culture results may not be optimal due to an inadequate volume of blood received in culture bottles   Culture   Final    NO GROWTH 2 DAYS Performed at [facility], [address] [place], [place] [address]    Report Status PENDING  Incomplete  SARS Coronavirus 2 [facility] order, Performed in [facility] hospital lab) Nasopharyngeal Nasopharyngeal Swab     Status: None   Collection Time: 03/13/2019 10:09 AM   Specimen: Nasopharyngeal Swab  Result Value Ref Range Status   SARS Coronavirus  2 NEGATIVE NEGATIVE Final    Comment: (NOTE) If result is NEGATIVE SARS-CoV-2 target nucleic acids are NOT DETECTED. The SARS-CoV-2 RNA is generally detectable in upper and lower  respiratory specimens during the acute phase of infection. The lowest  concentration of SARS-CoV-2 viral copies this assay can detect is 250  copies / mL. A negative result does not preclude SARS-CoV-2 infection  and should not be used as the sole basis for treatment or other  patient management decisions.  A negative result may occur with  improper  specimen collection / handling, submission of specimen other  than nasopharyngeal swab, presence of viral mutation(s) within the  areas targeted by this assay, and inadequate number of viral copies  (<250 copies / mL). A negative result must be combined with clinical  observations, patient history, and epidemiological information. If result is POSITIVE SARS-CoV-2 target nucleic acids are DETECTED. The SARS-CoV-2 RNA is generally detectable in upper and lower  respiratory specimens dur ing the acute phase of infection.  Positive  results are indicative of active infection with SARS-CoV-2.  Clinical  correlation with patient history and other diagnostic information is  necessary to determine patient infection status.  Positive results do  not rule out bacterial infection or co-infection with other viruses. If result is PRESUMPTIVE POSTIVE SARS-CoV-2 nucleic acids MAY BE PRESENT.   A presumptive positive result was obtained on the submitted specimen  and confirmed on repeat testing.  While 2019 novel coronavirus  (SARS-CoV-2) nucleic acids may be present in the submitted sample  additional confirmatory testing may be necessary for epidemiological  and / or clinical management purposes  to differentiate between  SARS-CoV-2 and other Sarbecovirus currently known to infect humans.  If clinically indicated additional testing with an alternate test  methodology [phone]) is advised. The SARS-CoV-2 RNA is generally  detectable in upper and lower respiratory sp ecimens during the acute  phase of infection. The expected result is Negative. Fact Sheet for Patients:  StrictlyIdeas.no Fact Sheet for Healthcare Providers: BankingDealers.co.za This test is not yet approved or cleared by the [place] FDA and has been authorized for detection and/or diagnosis of SARS-CoV-2 by FDA under an Emergency Use Authorization (EUA).  This EUA will remain in  effect (meaning this test can be used) for the duration of the COVID-19 declaration under Section 564(b)(1) of the Act, 21 U.S.C. section 360bbb-3(b)(1), unless the authorization is terminated or revoked sooner. Performed at [facility], [address] [place], [place] [address]   MRSA PCR Screening     Status: None   Collection Time: 04/07/19  6:31 AM   Specimen: Nasal Mucosa; Nasopharyngeal  Result Value Ref Range Status   MRSA by PCR NEGATIVE NEGATIVE Final    Comment:        The GeneXpert MRSA Assay (FDA approved for NASAL specimens only), is one component of a comprehensive MRSA colonization surveillance program. It is not intended to diagnose MRSA infection nor to guide or monitor treatment for MRSA infections. Performed at [facility], [address] [place], [place] [address]           IMAGING    Dg Chest 1 View  Result Date: 04/08/2019 CLINICAL DATA:  58 year old male with shortness of breath. Tested negative for COVID-19 two days ago. EXAM: CHEST  1 VIEW COMPARISON:  Portable chest 03/25/2019. FINDINGS: Portable AP upright view at 0347 hours. Stable right chest porta cath. Stable cardiac size and mediastinal contours. Visualized tracheal air column is within  normal limits. Mildly lower lung volumes. New coarse and confluent opacity at the right lung base. Increasing confluence of similar opacity in the left mid and lower lung. No superimposed pneumothorax or pleural effusion. Negative visible bowel gas pattern. No acute osseous abnormality identified. IMPRESSION: 1. Increasing bilateral pulmonary opacity compatible with progressive pneumonia. Consider viral/atypical etiology. No pleural effusion. 2. Underlying chronic lung disease. Electronically Signed   By: Genevie Ann M.D.   On: 04/08/2019 03:58       ASSESSMENT AND PLAN SYNOPSIS   Severe ACUTE Hypoxic and Hypercapnic Respiratory Failure From end stage COPD and end stage Lung cancer with  bone mets  Patient is suffering and dying  The wife has agreed to comfort care measures  morphine 2 every 15 mins ordered   Critical Care Time devoted to patient care services described in this note is 45 minutes.   Overall, patient is critically ill, prognosis is guarded.  Patient with Multiorgan failure and at high risk for cardiac arrest and death.    Corrin Parker, M.D.  [facility]  Medical Director [place] Director [facility] Cardio-Pulmonary Department

## 2019-04-08 NOTE — Consult Note (Signed)
Pharmacy Antibiotic Note  Isaac Cooper is a 58 y.o. male admitted on 03/23/2019 with sepsis likely due to mult-focal PNA with acute on chronic hypoxic respiratory failure.  The patient also has comorbid squamous cell lung cancer with bone metastasis.  Pharmacy has been consulted for cefepime dosing.  Vancomycin was discontinued after MRSA PCR resulted negative.  Patient is on day 3 of antibiotics.     Plan: 1) Will continue cefepime 2 grams IV every 8 hours  Pharmacy will continue to monitor renal function with AM labs.  Height: 5\' 7"  (170.2 cm) Weight: 119 lb 9.6 oz (54.3 kg) IBW/kg (Calculated) : 66.1  Temp (24hrs), Avg:98.6 F (37 C), Min:97.9 F (36.6 C), Max:99.3 F (37.4 C)  Recent Labs  Lab 03/13/2019 0936 03/14/2019 1138 04/07/19 0531 04/08/19 0524  WBC 9.5  --  3.6*  --   CREATININE 0.71  --  0.40* 0.47*  LATICACIDVEN 2.2* 2.3*  --   --     Estimated Creatinine Clearance: 76.4 mL/min (A) (by C-G formula based on SCr of 0.47 mg/dL (L)).    No Known Allergies  Antimicrobials this admission: vancomycin 9/27 >>  cefepime 9/27 >>   Microbiology results: 9/27 BCx: NGTD 9/28 MRSA PCR:  negative 9/27 SARS CoV-2: negative   Thank you for allowing pharmacy to be a part of this patient's care.  Gerald Dexter, PharmD Pharmacy Resident  04/08/2019 1:43 PM

## 2019-04-08 NOTE — Progress Notes (Signed)
[place] [facility] [place] NAME: Isaac Cooper    MR#:  [id number]  DATE OF BIRTH:  19-Mar-1961  SUBJECTIVE:   wife at bedside Has respiratory distress overnight.  Was on high flow nasal cannula but back on nasal cannula.  Family looking into hospice or palliative care at discharge.  Still has a cough no fevers overnight.  Still feels very short of breath.  REVIEW OF SYSTEMS:    Review of Systems  Constitutional:no  fever, chills weight loss HENT: Negative for ear pain, nosebleeds, congestion, facial swelling, rhinorrhea, neck pain, neck stiffness and ear discharge.   Respiratory:++ for cough, shortness of breath, wheezing  Cardiovascular: Negative for chest pain, palpitations and leg swelling.  Gastrointestinal: Negative for heartburn, abdominal pain, vomiting, diarrhea or consitpation Genitourinary: Negative for dysuria, urgency, frequency, hematuria Musculoskeletal: Negative for back pain or joint pain Neurological: Negative for dizziness, seizures, syncope, focal weakness,  numbness and headaches.  Hematological: ++Does  bruise/bleed easily.  Psychiatric/Behavioral: Negative for hallucinations, confusion, dysphoric mood    Tolerating Diet: yes      DRUG ALLERGIES:  No Known Allergies  VITALS:  Blood pressure 103/65, pulse (!) 103, temperature 97.9 F (36.6 C), temperature source Oral, resp. rate 20, height 5\' 7"  (1.702 m), weight 54.3 kg, SpO2 100 %.  PHYSICAL EXAMINATION:  Constitutional: Appears well-developed and well-nourished. No distress. HENT: Normocephalic. Marland Kitchen Oropharynx is clear and moist.  Eyes: Conjunctivae and EOM are normal. PERRLA, no scleral icterus.  Neck: Normal ROM. Neck supple. No JVD. No tracheal deviation. CVS: RRR, S1/S2 +, no murmurs, no gallops, no carotid bruit.  Pulmonary: b/l rhonchii  Abdominal: Soft. BS +,  no distension, tenderness, rebound or guarding.  Musculoskeletal: Normal range of motion. No edema  and no tenderness.  Neuro: No focal deficits. Skin: Skin is warm and dry. No rash noted.some brusing on arms noted Psychiatric: sleepy     LABORATORY PANEL:   CBC Recent Labs  Lab 04/07/19 0531  WBC 3.6*  HGB 8.7*  HCT 27.6*  PLT 157   ------------------------------------------------------------------------------------------------------------------  Chemistries  Recent Labs  Lab 03/15/2019 0936 04/07/19 0531 04/08/19 0524  NA 136 134* 128*  K 4.1 3.4* 4.3  CL 99 100 91*  CO2 28 29 27   GLUCOSE 88 105* 99  BUN 30* 12 12  CREATININE 0.71 0.40* 0.47*  CALCIUM 8.5* 8.1* 8.6*  MG  --  1.9  --   AST 40  --   --   ALT 33  --   --   ALKPHOS 69  --   --   BILITOT 0.9  --   --    ------------------------------------------------------------------------------------------------------------------  Cardiac Enzymes No results for input(s): TROPONINI in the last 168 hours. ------------------------------------------------------------------------------------------------------------------  RADIOLOGY:  Dg Chest 1 View  Result Date: 04/08/2019 CLINICAL DATA:  58 year old male with shortness of breath. Tested negative for COVID-19 two days ago. EXAM: CHEST  1 VIEW COMPARISON:  Portable chest 03/25/2019. FINDINGS: Portable AP upright view at 0347 hours. Stable right chest porta cath. Stable cardiac size and mediastinal contours. Visualized tracheal air column is within normal limits. Mildly lower lung volumes. New coarse and confluent opacity at the right lung base. Increasing confluence of similar opacity in the left mid and lower lung. No superimposed pneumothorax or pleural effusion. Negative visible bowel gas pattern. No acute osseous abnormality identified. IMPRESSION: 1. Increasing bilateral pulmonary opacity compatible with progressive pneumonia. Consider viral/atypical etiology. No pleural effusion. 2. Underlying chronic lung disease. Electronically  Signed   By: Genevie Ann M.D.   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  1.  Sepsis with acute on chronic hypoxic respiratory failure: Presented with hypotension and tachycardia.  Sepsis is due to to bilateral pneumonia.  Wean oxygen to 2 L nasal cannula as tolerated. Cultures negative to date Sepsis is resolving  2.  Bilateral multifocal pneumonia: Continue cefepime.   MRSA screening is negative and therefore vancomycin has been discontinued. Respiratory viral panel ordered   3.  Squamous cell lung cancer with bone mets followed at [facility]: Discussed with patient and family.  They are agreeable to palliative care consult.  4.  Acute metabolic encephalopathy in the setting of above issues with have improved.  5.  Mild hypokalemia: Resolved  6. Hyponatremia from poor po intake I may need to restart IVF .   Overall poor prognosis OPC service consult placed and left VM for his oncologist Dr [phone]    Management plans discussed with the patient and wife and they are in agreement.  CODE STATUS: DNR  TOTAL TIME TAKING CARE OF THIS PATIENT: 28 minutes.     POSSIBLE D/C 2-4 days, DEPENDING ON CLINICAL CONDITION.   Bettey Costa M.D on 04/08/2019 at 12:53 PM  Between 7am to 6pm - Pager - [phone] After 6pm go to [email or web address] - password [email or web address] [facility] [facility]  Office  [phone]  CC: Primary care physician; Cletis Athens, MD  Note: This dictation was prepared with Dragon dictation along with smaller phrase technology. Any transcriptional errors that result from this process are unintentional.

## 2019-04-08 NOTE — Consult Note (Signed)
Consultation Note Date: 04/08/2019   Patient Name: Isaac Cooper  DOB: [birth year]  MRN: [id number]  Age / Sex: 58 y.o., male   PCP: Isaac Athens, MD Referring Physician: Bettey Costa, MD   REASON FOR CONSULTATION:Establishing goals of care  Palliative Care consult requested for this 58 y.o. 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 with Dr. Posey Cooper. He presented to ED with complaints of respiratory distress. Wife reported home oxygen saturations were in the 60's. Since admission patient continues to have respiratory distress requiring use of high flow nasal cannula at times. Receiving IV hydration due to poor po intake. He is being treated with IV antibiotics for sepsis and bilateral multifocal pneumonia. Hypotensive at times. Unfortunately patient developed increased work of breathing on today requiring NRB. Condition continues to decline. Palliative consulted for goals of care discussion.   Clinical Assessment and Goals of Care: I have reviewed medical records including lab results, imaging, Epic notes, and MAR, received report from the bedside RN, and assessed the patient. I met at the bedside with patient's wife, 2 sons, parents, and siblings to discuss diagnosis prognosis, GOC, EOL wishes, disposition and options. Patient has been seen by Dr. Mortimer Cooper with recommendations for comfort care due to severe decline with a low chance of meaningful recovery. I initially was to meet with patient and wife tomorrow morning however after notification of change in condition and wife's request came to meet with family tonight.   I introduced Palliative Medicine as specialized medical care for people living with serious illness. It focuses on providing relief from the symptoms and stress of a serious illness. The goal is to improve quality of life for both the patient and the family.  Patient  with audible secretions and noticeable respiratory distress on NRB. Family is at the bedside tearful. Chaplain recently at bedside offering support. Wife appreciative of my presence.   We discussed a brief life review of the patient, along with functional and nutritional status. Wife reports they have been married for 36 years and have 2 sons who are at the bedside. They have 3 grandsons. 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   Prior to admission patient required assistance with ADLs due to fatigue, more so around the time he was undergoing treatments. He has loss quite of bit of weight per wife due to poor po intake.   Wife holding onto patient's hand and joking that he finally was smaller than she was. Patient awake and able to respond stating "yes and smiling". Wife tearful and kissed her husband. Family spent time sharing memories of patient and his humble and gentle spirit.   Wife states the goal is for patient to be comfortable and not in distress. I educated family in detail regarding patient's symptoms and management as well as the dying process. Family tearful. We discussed medication use and symptom management.   I educated family on the use of morphine and potentially initiating a morphine drip for better symptom management such as respiratory distress, air hunger, and pain. Sons would not like to initiate a drip but continue with PRN morphine for now with fear their father will not be able to communicate with them. Support given. We also discussed the goal of comfort and although  patient may become unresponsive keeping him comfort and not suffering during the EOL process. They verbalized understanding and agreement. Wife request to have morphine drip available if needed and to further see how patient does during the night. If patient continues to struggle they  would then agree with drip.   I attempted to elicit values and goals of care important to the patient.    Wife confirms wishes for full comfort care and DNR/DNI.   Questions and concerns were addressed. The family was encouraged to call with questions or concerns.  PMT will continue to support holistically.   SOCIAL HISTORY:     reports that he quit smoking about 10 months ago. His smoking use included cigarettes. He has a 56.25 pack-year smoking history. He has never used smokeless tobacco. He reports current alcohol use. He reports that he does not use drugs.  CODE STATUS: DNR  ADVANCE DIRECTIVES: Isaac Cooper (wife)   SYMPTOM MANAGEMENT: see below   Palliative Prophylaxis:   Aspiration, Delirium Protocol, Frequent Pain Assessment, Oral Care and Turn Reposition  PSYCHO-SOCIAL/SPIRITUAL:  Support System: Family   Desire for further Chaplaincy support:YES   Additional Recommendations (Limitations, Scope, Preferences):  Full Comfort Care   PAST MEDICAL HISTORY: Past Medical History:  Diagnosis Date  . Colon polyps   . 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 Lung cancer ([place])     PAST SURGICAL HISTORY:  Past Surgical History:  Procedure Laterality Date  . BACK SURGERY    . TESTICLE SURGERY    . TONSILLECTOMY AND ADENOIDECTOMY      ALLERGIES:  has No Known Allergies.   MEDICATIONS:  Current Facility-Administered Medications  Medication Dose Route Frequency Provider Last Rate Last Dose  . 0.9 %  sodium chloride infusion   Intravenous Continuous Isaac Costa, MD 75 mL/hr at 04/08/19 1325    . acetaminophen (TYLENOL) tablet 500 mg  500 mg Oral TID Isaac Cooper, Jude, MD   500 mg at 04/08/19 7001  . benzonatate (TESSALON) capsule 200 mg  200 mg Oral TID PRN Isaac Cooper, Jude, MD      . ceFEPIme (MAXIPIME) 2 g in sodium chloride 0.9 % 100 mL IVPB  2 g Intravenous Q8H Isaac Cooper, Isaac Cooper 200 mL/hr at 04/08/19 1349 2 g at 04/08/19 1349  . dexamethasone (DECADRON) tablet 2 mg   2 mg Oral Daily Isaac Cooper, Jude, MD   2 mg at 04/08/19 0944  . DULoxetine (CYMBALTA) DR capsule 30 mg  30 mg Oral Daily Isaac Cooper, Jude, MD   30 mg at 04/08/19 0944  . enoxaparin (LOVENOX) injection 40 mg  40 mg Subcutaneous Q24H Isaac Cooper, Jude, MD   40 mg at 04/07/19 2219  . gabapentin (NEURONTIN) capsule 600 mg  600 mg Oral TID Isaac Cooper, Jude, MD   600 mg at 04/08/19 7494  . ipratropium-albuterol (DUONEB) 0.5-2.5 (3) MG/3ML nebulizer solution 3 mL  3 mL Nebulization Q6H Isaac Cooper, Jude, MD   3 mL at 04/08/19 1309  . LORazepam (ATIVAN) injection 1 mg  1 mg Intravenous Q4H PRN Gouru, Aruna, MD   1 mg at 04/08/19 1909  . MEDLINE mouth rinse  15 mL Mouth Rinse BID Isaac Cooper, Jude, MD   15 mL at 04/08/19 0954  . mometasone-formoterol (DULERA) 200-5 MCG/ACT inhaler 2 puff  2 puff Inhalation BID Isaac Cooper, Jude, MD   2 puff at 04/08/19 0944  . morphine (MS CONTIN) 12 hr tablet 200 mg  200 mg Oral TID Isaac Cooper, Jude, MD   200 mg at 04/08/19 1004  .  morphine 2 MG/ML injection 2 mg  2 mg Intravenous Q15 min PRN Flora Lipps, MD   2 mg at 04/08/19 1858  . nystatin (MYCOSTATIN) 100000 UNIT/ML suspension 500,000 Units  5 mL Oral QID Lang Snow, NP   500,000 Units at 04/08/19 [phone]  . ondansetron (ZOFRAN-ODT) disintegrating tablet 4 mg  4 mg Oral Daily Isaac Cooper, Jude, MD   4 mg at 04/08/19 4696  . oxyCODONE (Oxy IR/ROXICODONE) immediate release tablet 60 mg  60 mg Oral TID Isaac Cooper, Jude, MD   60 mg at 04/08/19 0634  . polyethylene glycol (MIRALAX / GLYCOLAX) packet 17 g  17 g Oral Maryfrances Bunnell, Jude, MD   17 g at 04/08/19 0943  . prochlorperazine (COMPAZINE) tablet 10 mg  10 mg Oral Q2000 Isaac Cooper, Jude, MD   10 mg at 04/07/19 2219  . tiotropium (SPIRIVA) inhalation capsule ([facility] use ONLY) 18 mcg  18 mcg Inhalation Daily Isaac Cooper, Jude, MD   18 mcg at 04/08/19 0944    VITAL SIGNS: BP 133/75 (BP Location: Left Arm)   Pulse (!) 112   Temp 98.5 F (36.9 C) (Oral)   Resp 20   Ht '5\' 7"'  (1.702 m)   Wt 54.3 kg   SpO2 95%   BMI 18.73 kg/m  Filed  Weights   03/19/2019 1505 04/07/19 0441 04/08/19 0441  Weight: 56.2 kg 56 kg 54.3 kg    Estimated body mass index is 18.73 kg/m as calculated from the following:   Height as of this encounter: '5\' 7"'  (1.702 m).   Weight as of this encounter: 54.3 kg.  LABS: CBC:    Component Value Date/Time   WBC 3.6 (L) 04/07/2019 0531   HGB 8.7 (L) 04/07/2019 0531   HGB 13.3 08/14/2013 0741   HCT 27.6 (L) 04/07/2019 0531   HCT 38.9 (L) 08/14/2013 0741   PLT 157 04/07/2019 0531   PLT 229 08/14/2013 0741   Comprehensive Metabolic Panel:    Component Value Date/Time   NA 128 (L) 04/08/2019 0524   NA 135 (L) 08/14/2013 0741   K 4.3 04/08/2019 0524   K 4.1 08/14/2013 0741   CO2 27 04/08/2019 0524   CO2 27 08/14/2013 0741   BUN 12 04/08/2019 0524   BUN 12 08/14/2013 0741   CREATININE 0.47 (L) 04/08/2019 0524   CREATININE 0.98 08/14/2013 0741   ALBUMIN 2.9 (L) 03/19/2019 0936   ALBUMIN 3.4 08/14/2013 0741     Review of Systems  Unable to perform ROS: Acuity of condition  Unless otherwise noted, a complete review of systems is negative.  Physical Exam General: respiratory failure, chronically-ill frail appearing, cachectic Cardiovascular: tachycardic  Pulmonary: tachypnea, audible secretions, rhonchi, NRB   Abdomen: soft, nontender, + bowel sounds Extremities: no edema, no joint deformities Skin: no rashes, warm, dry  Neurological: obtunded   Prognosis: Hours - Days in the setting of transitioning to full comfort, metastatic lung cancer, protein calorie malnutrition.   Discharge Planning:  Anticipated Hospital Death  Recommendations:  DNR/DNI-as confirmed by wife  Full Comfort Care   Family is at the bedside for EOL support. Unrestricted visitation allowed.   Will d/c all orders not comfort focused  Morphine PRN as ordered  Will place Morphine drip with bolus orders under signed and held for release if symptoms are not effectively managed with PRN medication.   Robinul  for excessive secretions  Ativan PRN for anxiety/agitation  Liquifilm tears for dry eyes  Tylenol PRN for fever/pain  Comfort cart for family  Chaplain for spiritual support  PMT will continue to support and follow    Palliative Performance Scale: ACTIVELY DYING               Family expressed understanding and was in agreement with this plan.   Thank you for allowing the Palliative Medicine Team to assist in the care of this patient.  Time In: 1935 Time Out: 2050 Time Total: 75 min.   Visit consisted of counseling and education dealing with the complex and emotionally intense issues of symptom management and palliative care in the setting of serious and potentially life-threatening illness.Greater than 50%  of this time was spent counseling and coordinating care related to the above assessment and plan.  Signed by:  Alda Lea, AGPCNP-BC Palliative Medicine Team  Phone: [phone] Fax: [phone] Pager: [phone] Amion: Bjorn Pippin

## 2019-04-09 LAB — EPSTEIN-BARR VIRUS VCA, IGM: EBV VCA IgM: <36 U/mL (ref 0.0–35.9)

## 2019-04-09 LAB — CMV IGM: CMV IgM: <30 AU/mL (ref 0.0–29.9)

## 2019-04-09 LAB — EPSTEIN-BARR VIRUS VCA, IGG: EBV VCA IgG: 499 U/mL — ABNORMAL HIGH (ref 0.0–17.9)

## 2019-04-09 MED ORDER — LORAZEPAM 2 MG/ML IJ SOLN
2.0000 mg | INTRAMUSCULAR | Status: DC | PRN
  Administered 2019-04-09 – 2019-04-10 (×4): 2 mg via INTRAVENOUS
  Filled 2019-04-09 (×4): qty 1

## 2019-04-09 MED ORDER — GLYCOPYRROLATE 0.2 MG/ML IJ SOLN
0.4000 mg | INTRAMUSCULAR | Status: DC | PRN
  Administered 2019-04-09 – 2019-04-10 (×3): 0.4 mg via INTRAVENOUS
  Filled 2019-04-09 (×4): qty 2

## 2019-04-09 NOTE — Progress Notes (Signed)
Daily Progress Note   Patient Name: Isaac Cooper       Date: 04/09/2019 DOB: 07-07-1961  Age: 58 y.o. MRN#: [id number] Attending Physician: Vaughan Basta, * Primary Care Physician: Cletis Athens, MD Admit Date: 03/17/2019  Reason for Consultation/Follow-up: Non pain symptom management, Pain control and Psychosocial/spiritual support  Subjective: Patient showing signs of respiratory distress and discomfort. Occasional moans. Obtunded and actively dying, although still somewhat able to respond to wife. Nasal Cannula @ 6L. Wife and 2 sisters at the bedside. Updates provided on condition. Sister calling patient's parents and sons to come to the bedside as patient continues to show signs of actively dying. They are aware he could pass away at anytime.   Danielle, RN at the bedside providing excellent comfort care and support to family. Family initiated on morphine drip during the night due to increased respiratory distress. Parameters increased for further symptom management. Audible secretions. Discussed with family use of medications to assist with better symptom management and patient active signs of death. Bilateral lower extremity mottling noted.   Family tearful and loving on patient. Support and comfort provided. Remained at bedside for support to family. Patient much more comfortable with medications. Family thankful.     Length of Stay: 3  Current Medications: Scheduled Meds:  . mouth rinse  15 mL Mouth Rinse BID  . scopolamine  1 patch Transdermal Q72H    Continuous Infusions: . morphine 10 mg/hr (04/09/19 1143)    PRN Meds: acetaminophen, antiseptic oral rinse, glycopyrrolate, LORazepam, morphine, ondansetron (ZOFRAN) IV, polyvinyl alcohol  Physical Exam        -chronically-ill appearing, cachectic  -tachycardic -tachypnea, nasal cannula  -obtunded, actively dying     Vital Signs: BP 104/73 (BP Location: Left Arm)   Pulse (!) 120   Temp 98.1 F (36.7 C) (Oral)   Resp 19   Ht 5\' 7"  (1.702 m)   Wt 54.6 kg   SpO2 100%   BMI 18.84 kg/m  SpO2: SpO2: 100 % O2 Device: O2 Device: High Flow Nasal Cannula O2 Flow Rate: O2 Flow Rate (L/min): 11 L/min  Intake/output summary:   Intake/Output Summary (Last 24 hours) at 04/09/2019 1306 Last data filed at 04/09/2019 1144 Gross per 24 hour  Intake 66.9 ml  Output 550 ml  Net -483.1 ml   LBM: Last  BM Date: 04/07/19 Baseline Weight: Weight: 56.2 kg Most recent weight: Weight: 54.6 kg       Palliative Assessment/Data: ACTIVELY DYING     Patient Active Problem List   Diagnosis Date Noted  . Lung cancer ([place]) 09/13/2018  . HCAP (healthcare-associated pneumonia) 09/13/2018  . Sepsis ([place]) 09/13/2018  . Umbilical hernia without obstruction and without gangrene 11/30/2016  . Erectile dysfunction 11/30/2016    Palliative Care Assessment & Plan     Recommendations:  Continue with full comfort care measures  Family educated and aware of anticipated hospital death  Robinul PRN for secretions  Morphine drip w/bolus for symptom control/comfort  PMT will continue to support and follow  Goals of Care and Additional Recommendations:  Limitations on Scope of Treatment: Full Comfort Care  Code Status:    Code Status Orders  (From admission, onward)         Start     Ordered   04/08/19 2003  Do not attempt resuscitation (DNR)  Continuous    Question Answer Comment  In the event of cardiac or respiratory ARREST Do not call a "code blue"   In the event of cardiac or respiratory ARREST Do not perform Intubation, CPR, defibrillation or ACLS   In the event of cardiac or respiratory ARREST Use medication by any route, position, wound care, and other measures to relive pain and suffering.  May use oxygen, suction and manual treatment of airway obstruction as needed for comfort.   Comments Patient wishes to be DNR/DNI going forward.  Wife at bedside agrees with his decision.   rn may pronounce      04/08/19 2005        Code Status History    Date Active Date Inactive Code Status Order ID Comments User Context   04/08/2019 1905 04/08/2019 2005 DNR [id number]  Nicholes Mango, MD Inpatient   04/03/2019 1242 04/08/2019 1905 DNR [id number]  Otila Back, MD ED   09/13/2018 2318 09/17/2018 1510 Full Code [id number]  Lance Coon, MD Inpatient   Advance Care Planning Activity    Advance Directive Documentation     Most Recent Value  Type of Advance Directive  Healthcare Power of Attorney  Pre-existing out of facility DNR order (yellow form or pink MOST form)  -  "MOST" Form in Place?  -      Prognosis:   Hours - Days  Discharge Planning:  Anticipated Hospital Death  Care plan was discussed with RN, family, and Dr. Anselm Jungling.   Thank you for allowing the Palliative Medicine Team to assist in the care of this patient.  Total Time: 35 min.   Greater than 50%  of this time was spent counseling and coordinating care related to the above assessment and plan.  Alda Lea, AGPCNP-BC Palliative Medicine Team  Phone: [phone] Pager: [phone] Amion: Bjorn Pippin   Please contact Palliative Medicine Team phone at [phone] for questions and concerns.

## 2019-04-09 NOTE — Progress Notes (Signed)
[place] [facility] [place] NAME: Isaac Cooper    MR#:  [id number]  DATE OF BIRTH:  [birth year]  SUBJECTIVE:   wife , sisters at bedside Started on comfort care yesterday and on NRBM and morphine drip when seen around 11 am.  REVIEW OF SYSTEMS:    Pt is some drowsy with morphine drip, appears comfortable.  Tolerating Diet: yes  DRUG ALLERGIES:  No Known Allergies  VITALS:  Blood pressure 104/73, pulse (!) 120, temperature 98.1 F (36.7 C), temperature source Oral, resp. rate 19, height 5\' 7"  (1.702 m), weight 54.6 kg, SpO2 100 %.  PHYSICAL EXAMINATION:  Constitutional: Appears well-developed and well-nourished. No distress. Comfortable. HENT: Normocephalic. Marland Kitchen Oropharynx is clear and moist.  Eyes: Conjunctivae and EOM are normal. PERRLA, no scleral icterus.  Neck: Normal ROM. Neck supple. No JVD. No tracheal deviation. CVS: RRR, S1/S2 +, no murmurs, no gallops, no carotid bruit.  Pulmonary: b/l rhonchii , NRBM in use. Abdominal: Soft. BS +,  no distension, tenderness, rebound or guarding.  Musculoskeletal: Normal range of motion. No edema and no tenderness.  Neuro: No focal deficits. Skin: Skin is warm and dry. No rash noted.some brusing on arms noted Psychiatric: sleepy     LABORATORY PANEL:   CBC Recent Labs  Lab 04/07/19 0531  WBC 3.6*  HGB 8.7*  HCT 27.6*  PLT 157   ------------------------------------------------------------------------------------------------------------------  Chemistries  Recent Labs  Lab 04/05/2019 0936 04/07/19 0531 04/08/19 0524  NA 136 134* 128*  K 4.1 3.4* 4.3  CL 99 100 91*  CO2 28 29 27   GLUCOSE 88 105* 99  BUN 30* 12 12  CREATININE 0.71 0.40* 0.47*  CALCIUM 8.5* 8.1* 8.6*  MG  --  1.9  --   AST 40  --   --   ALT 33  --   --   ALKPHOS 69  --   --   BILITOT 0.9  --   --     ------------------------------------------------------------------------------------------------------------------  Cardiac Enzymes No results for input(s): TROPONINI in the last 168 hours. ------------------------------------------------------------------------------------------------------------------  RADIOLOGY:  Dg Chest 1 View  Result Date: 04/08/2019 CLINICAL DATA:  58 year old male with shortness of breath. Tested negative for COVID-19 two days ago. EXAM: CHEST  1 VIEW COMPARISON:  Portable chest 03/16/2019. FINDINGS: Portable AP upright view at 0347 hours. Stable right chest porta cath. Stable cardiac size and mediastinal contours. Visualized tracheal air column is within normal limits. Mildly lower lung volumes. New coarse and confluent opacity at the right lung base. Increasing confluence of similar opacity in the left mid and lower lung. No superimposed pneumothorax or pleural effusion. Negative visible bowel gas pattern. No acute osseous abnormality identified. IMPRESSION: 1. Increasing bilateral pulmonary opacity compatible with progressive pneumonia. Consider viral/atypical etiology. No pleural effusion. 2. Underlying chronic lung disease. Electronically Signed   By: Genevie Ann M.D.   On: 04/08/2019 03:58     ASSESSMENT AND PLAN:   58 year old male with history of squamous cell carcinoma of the lungs with bone metastasis presented to the emergency room due to respiratory distress and generalized weakness.  1.  Sepsis with acute on chronic hypoxic respiratory failure: Presented with hypotension and tachycardia.  Sepsis is due to to bilateral pneumonia.    2.  Bilateral multifocal pneumonia:   3.  Squamous cell lung cancer with bone mets followed at [facility]: Discussed with patient and family.  They are agreeable to palliative care and approved comfort care.  4.  Acute  metabolic encephalopathy in the setting of above issues   5.  Mild hypokalemia: Resolved  6. Hyponatremia from  poor po intake   Overall poor prognosis Comfort care.  Management plans discussed with the patient and wife and they are in agreement.  CODE STATUS: DNR  TOTAL TIME TAKING CARE OF THIS PATIENT: 28 minutes.    POSSIBLE D/C 2-4 days, DEPENDING ON CLINICAL CONDITION.   Vaughan Basta M.D on 04/09/2019 at 7:55 PM  Between 7am to 6pm - Pager - [phone] After 6pm go to [email or web address] - password [email or web address] [facility]  Office  [phone]  CC: Primary care physician; Cletis Athens, MD  Note: This dictation was prepared with Dragon dictation along with smaller phrase technology. Any transcriptional errors that result from this process are unintentional.

## 2019-04-09 NOTE — Clinical Social Work Note (Signed)
CSW acknowledges consult for hospice services. Palliative NP has determined patient is not appropriate for transfer. Anticipated hospital death.  Isaac Cooper, [name]

## 2019-04-09 NOTE — Progress Notes (Signed)
Attempted to remove patient oxygen for increased comfort however, patient once again became very restless with increased distress, which caused familty to become distressed and very emotional.  Several bolus dose of morphine and increase in basal rate were required to once again provide patient with comfort.  PRN dose of ativan given and oxygen placed back on patient at family's request.  Remain at bedside for a prolonged period of time to ensure patient's comfort and to provide family with needed support.  Family is very thankful to care they have received.  Clarise Cruz, BSN

## 2019-04-09 NOTE — Progress Notes (Addendum)
Patient terminally restless, pulling NRB off, trying to get up from bed.  Multiple bolus dose of morphine given for comfort and rate titrated to meet patient's comfort needs.  Wife at bedside is emotional and tearful, support given and questions answered. Suggested to wife that patient's children come sooner rather later as patient is declining. Chaplain in for visit to patient and wife.  Nasal cannula placed for patient's comfort.  Suggest   Will continue to adjust morphine gtt to keep patient comfortable.  Clarise Cruz, BSN

## 2019-04-10 LAB — RSV(RESPIRATORY SYNCYTIAL VIRUS) AB, BLOOD: RSV Ab: NEGATIVE

## 2019-04-10 DEATH — deceased

## 2019-04-11 LAB — CULTURE, BLOOD (ROUTINE X 2)
Culture: NO GROWTH
Culture: NO GROWTH

## 2019-05-11 NOTE — Progress Notes (Signed)
Notified patient had been declined as corneal donor/had been declined as tissue earlier. Full release.

## 2019-05-11 NOTE — Plan of Care (Signed)
  Problem: Education: Goal: Knowledge of General Education information will improve Description: Including pain rating scale, medication(s)/side effects and non-pharmacologic comfort measures Outcome: Adequate for Discharge   Problem: Health Behavior/Discharge Planning: Goal: Ability to manage health-related needs will improve Outcome: Adequate for Discharge   Problem: Clinical Measurements: Goal: Ability to maintain clinical measurements within normal limits will improve Outcome: Adequate for Discharge Goal: Will remain free from infection Outcome: Adequate for Discharge Goal: Diagnostic test results will improve Outcome: Adequate for Discharge Goal: Respiratory complications will improve Outcome: Adequate for Discharge Goal: Cardiovascular complication will be avoided Outcome: Adequate for Discharge   Problem: Activity: Goal: Risk for activity intolerance will decrease Outcome: Adequate for Discharge   Problem: Nutrition: Goal: Adequate nutrition will be maintained Outcome: Adequate for Discharge   Problem: Coping: Goal: Level of anxiety will decrease Outcome: Adequate for Discharge   Problem: Elimination: Goal: Will not experience complications related to bowel motility Outcome: Adequate for Discharge Goal: Will not experience complications related to urinary retention Outcome: Adequate for Discharge   Problem: Pain Managment: Goal: General experience of comfort will improve Outcome: Adequate for Discharge   Problem: Safety: Goal: Ability to remain free from injury will improve Outcome: Adequate for Discharge   Problem: Skin Integrity: Goal: Risk for impaired skin integrity will decrease Outcome: Adequate for Discharge   Problem: Fluid Volume: Goal: Hemodynamic stability will improve Outcome: Adequate for Discharge   Problem: Clinical Measurements: Goal: Diagnostic test results will improve Outcome: Adequate for Discharge Goal: Signs and symptoms of  infection will decrease Outcome: Adequate for Discharge   Problem: Respiratory: Goal: Ability to maintain adequate ventilation will improve Outcome: Adequate for Discharge   Problem: Activity: Goal: Ability to tolerate increased activity will improve Outcome: Adequate for Discharge   Problem: Clinical Measurements: Goal: Ability to maintain a body temperature in the normal range will improve Outcome: Adequate for Discharge   Problem: Respiratory: Goal: Ability to maintain adequate ventilation will improve Outcome: Adequate for Discharge Goal: Ability to maintain a clear airway will improve Outcome: Adequate for Discharge

## 2019-05-11 NOTE — Discharge Summary (Signed)
Date of death-10 April 2019.  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  He was initially started on antibiotics and oxygen support but due to worsening in condition and having extremely poor prognosis due to metastatic lung cancer family has agreed on making him comfortable and stopping all other meds and antibiotics. He died in hospital under comfort care measures.

## 2019-05-11 NOTE — Progress Notes (Signed)
Ch responded to a referral from another ch regarding pt that had just passed. Pt family were expressing grief and emotional distress related to the passing of the pt. Ch provided words of encouragement and allowed space for the family to express how much the pt meant to them in their lives. Most of the family shared that the pt never complained and did not want others to know that he was suffering with pain related to his illnesses. 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 Ch was aware that the customers were like a secondary family for the pt. Ch provided a prayer shawl and prayed with family. Ch checked in with the staff to inform them that the family had been attended to.    04/15/2019 1200  Clinical Encounter Type  Visited With Patient and family together;Health care provider  Visit Type Spiritual support;Social support;Death  Referral From Nurse  Consult/Referral To Chaplain  Spiritual Encounters  Spiritual Needs Emotional;Grief support;Prayer  Stress Factors  Family Stress Factors Loss;Loss of control;Major life changes;Exhausted;Family relationships

## 2019-05-11 DEATH — deceased

## 2019-05-15 LAB — BLOOD GAS, VENOUS
Acid-Base Excess: 4.4 mmol/L — ABNORMAL HIGH (ref 0.0–2.0)
Bicarbonate: 32.7 mmol/L — ABNORMAL HIGH (ref 20.0–28.0)
O2 Saturation: 31.2 %
Patient temperature: 37
pCO2, Ven: 65 mmHg — ABNORMAL HIGH (ref 44.0–60.0)
pH, Ven: 7.31 (ref 7.250–7.430)

## 2020-08-10 IMAGING — DX DG CHEST 1V PORT
1 series · 1 of 1 positions shown · non-contrast
Comparison: None.

CLINICAL DATA: Difficulty breathing today. Stage III lung cancer.
Productive cough.

EXAM:
PORTABLE CHEST 1 VIEW

[chest ap]
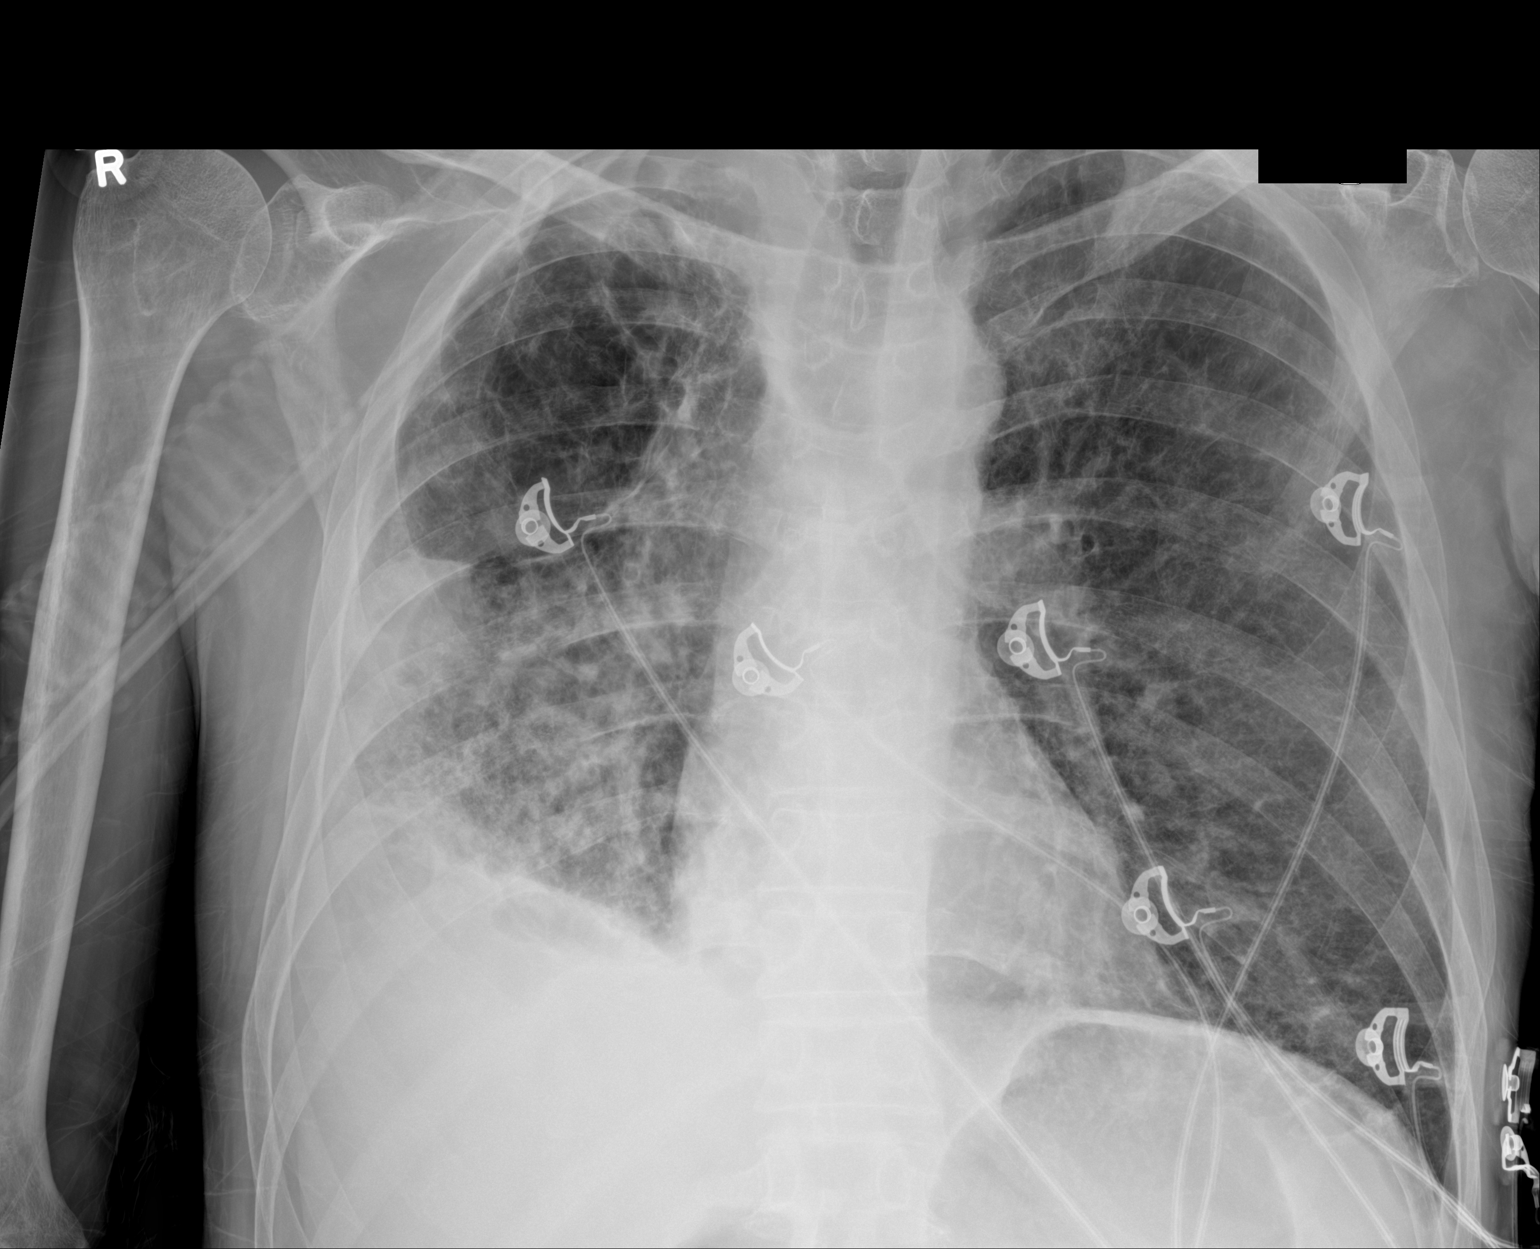

[1 of 1 positions shown; findings below may reference images not displayed]

FINDINGS: The heart size is normal. Right pleural effusion is present.
Interstitial and airspace disease superimposed on chronic lung
disease. Emphysematous changes are noted. No discrete mass lesion is
present.
IMPRESSION: 1. Asymmetric right lower lobe airspace disease. This is concerning
for acute edema or infection superimposed on chronic interstitial
lung disease.
2. Asymmetric right pleural effusion.

## 2021-03-05 IMAGING — DX DG CHEST 1V
1 series · 1 of 1 positions shown · non-contrast
Comparison: Portable chest 04/06/2019.

CLINICAL DATA: 58-year-old male with shortness of breath. Tested
negative for [id number] two days ago.

EXAM:
CHEST  1 VIEW

[chest ap]
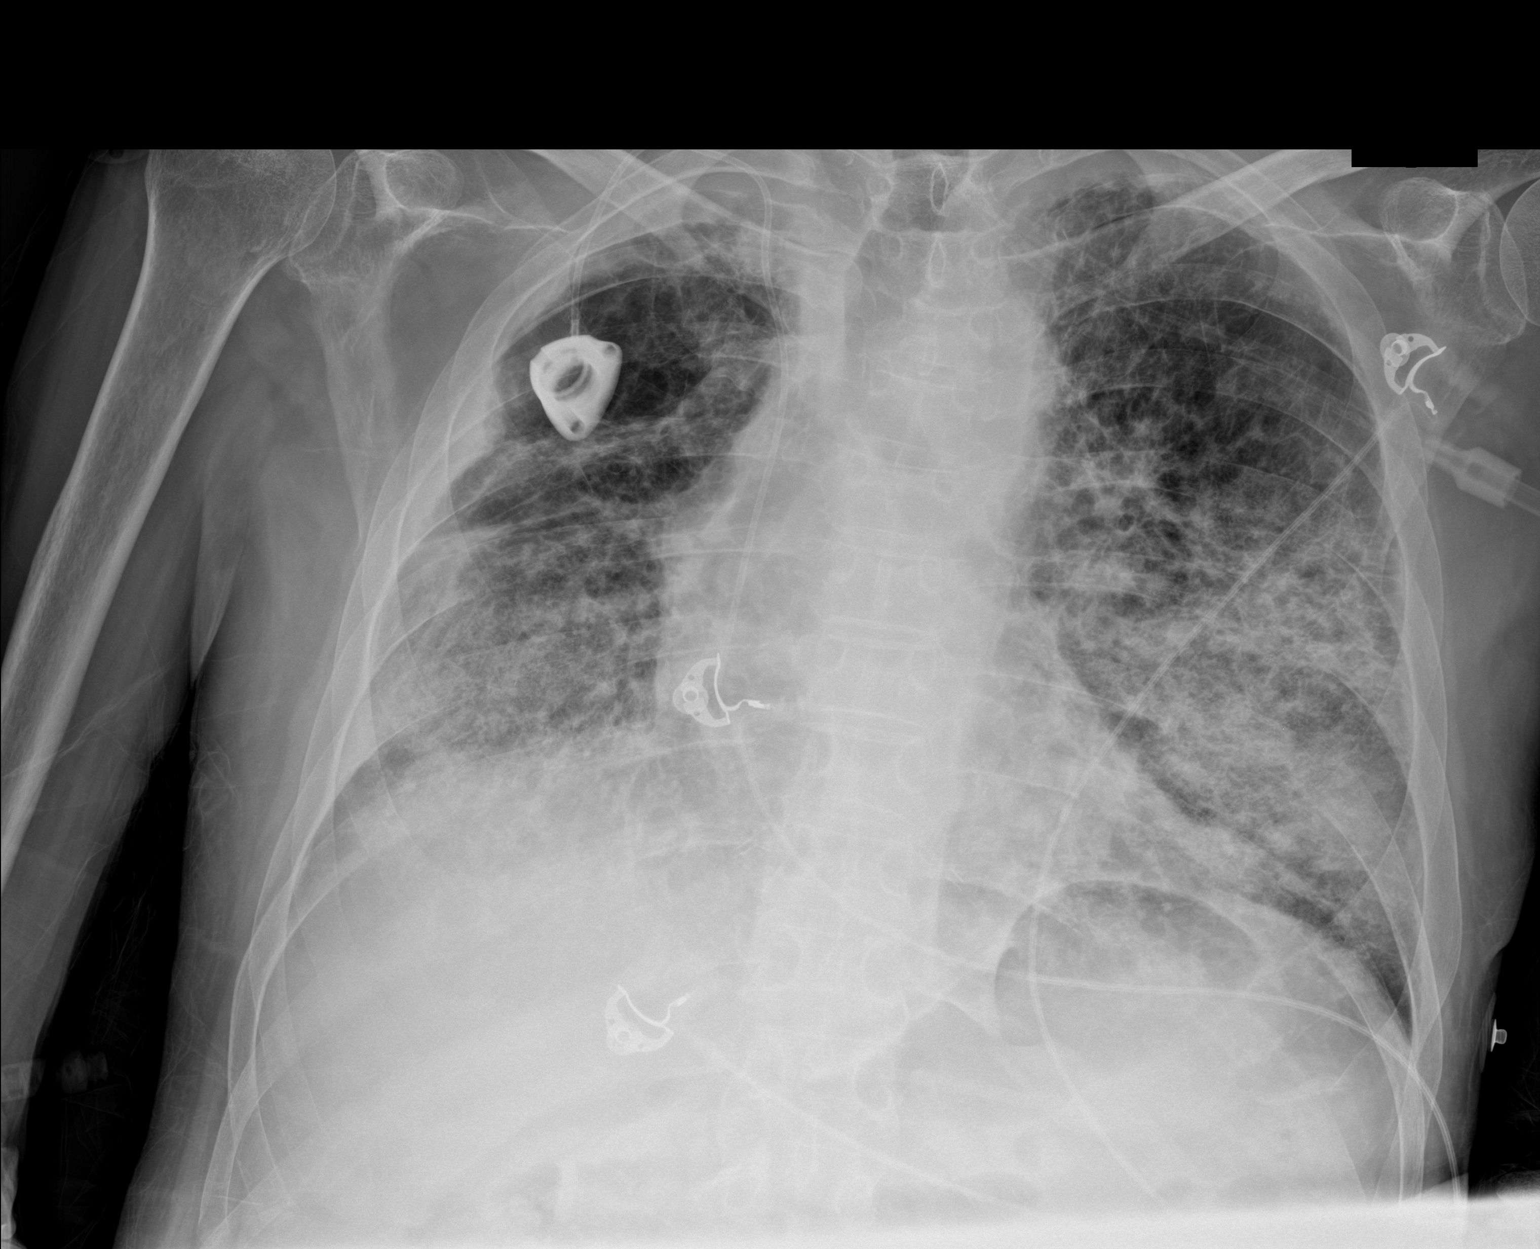

[1 of 1 positions shown; findings below may reference images not displayed]

FINDINGS: Portable AP upright view at 3356 hours. Stable right chest porta
cath. Stable cardiac size and mediastinal contours. Visualized
tracheal air column is within normal limits. Mildly lower lung
volumes.

New coarse and confluent opacity at the right lung base. Increasing
confluence of similar opacity in the left mid and lower lung. No
superimposed pneumothorax or pleural effusion.

Negative visible bowel gas pattern. No acute osseous abnormality
identified.
IMPRESSION: 1. Increasing bilateral pulmonary opacity compatible with
progressive pneumonia. Consider viral/atypical etiology. No pleural
effusion.
2. Underlying chronic lung disease.
